# Patient Record
Sex: Female | Born: 1955 | Race: White | Hispanic: No | Marital: Single | Smoking: Never smoker
Health system: Southern US, Community
[De-identification: ages and names within clinical notes are randomized; demographics above are authoritative.]

## PROBLEM LIST (undated history)

## (undated) DIAGNOSIS — M71162 Other infective bursitis, left knee: Secondary | ICD-10-CM

## (undated) DIAGNOSIS — D649 Anemia, unspecified: Secondary | ICD-10-CM

## (undated) DIAGNOSIS — F419 Anxiety disorder, unspecified: Secondary | ICD-10-CM

## (undated) DIAGNOSIS — K759 Inflammatory liver disease, unspecified: Secondary | ICD-10-CM

## (undated) DIAGNOSIS — F329 Major depressive disorder, single episode, unspecified: Secondary | ICD-10-CM

## (undated) DIAGNOSIS — R601 Generalized edema: Secondary | ICD-10-CM

## (undated) DIAGNOSIS — B1920 Unspecified viral hepatitis C without hepatic coma: Secondary | ICD-10-CM

## (undated) DIAGNOSIS — J189 Pneumonia, unspecified organism: Secondary | ICD-10-CM

## (undated) DIAGNOSIS — F32A Depression, unspecified: Secondary | ICD-10-CM

## (undated) DIAGNOSIS — K7460 Unspecified cirrhosis of liver: Secondary | ICD-10-CM

## (undated) HISTORY — PX: CHOLECYSTECTOMY: SHX55

---

## 2010-01-13 ENCOUNTER — Ambulatory Visit: Payer: Self-pay | Admitting: Cardiology

## 2010-01-13 ENCOUNTER — Inpatient Hospital Stay (HOSPITAL_COMMUNITY): Admission: EM | Admit: 2010-01-13 | Discharge: 2010-01-29 | Payer: Self-pay | Admitting: Emergency Medicine

## 2010-01-13 ENCOUNTER — Ambulatory Visit: Payer: Self-pay | Admitting: Internal Medicine

## 2010-01-18 ENCOUNTER — Ambulatory Visit: Payer: Self-pay | Admitting: Internal Medicine

## 2010-01-21 ENCOUNTER — Encounter (INDEPENDENT_AMBULATORY_CARE_PROVIDER_SITE_OTHER): Payer: Self-pay | Admitting: Internal Medicine

## 2010-01-25 ENCOUNTER — Ambulatory Visit: Payer: Self-pay | Admitting: Gastroenterology

## 2010-01-26 ENCOUNTER — Ambulatory Visit: Payer: Self-pay | Admitting: Gastroenterology

## 2010-02-02 ENCOUNTER — Encounter: Payer: Self-pay | Admitting: Internal Medicine

## 2010-02-02 DIAGNOSIS — K769 Liver disease, unspecified: Secondary | ICD-10-CM | POA: Insufficient documentation

## 2010-02-03 ENCOUNTER — Encounter: Payer: Self-pay | Admitting: Internal Medicine

## 2010-02-04 ENCOUNTER — Encounter (INDEPENDENT_AMBULATORY_CARE_PROVIDER_SITE_OTHER): Payer: Self-pay

## 2010-02-04 ENCOUNTER — Encounter (INDEPENDENT_AMBULATORY_CARE_PROVIDER_SITE_OTHER): Payer: Self-pay | Admitting: *Deleted

## 2010-02-04 ENCOUNTER — Ambulatory Visit: Payer: Self-pay | Admitting: Internal Medicine

## 2010-02-04 DIAGNOSIS — K7030 Alcoholic cirrhosis of liver without ascites: Secondary | ICD-10-CM

## 2010-02-05 ENCOUNTER — Telehealth (INDEPENDENT_AMBULATORY_CARE_PROVIDER_SITE_OTHER): Payer: Self-pay

## 2010-02-07 DIAGNOSIS — K7460 Unspecified cirrhosis of liver: Secondary | ICD-10-CM | POA: Insufficient documentation

## 2010-02-07 DIAGNOSIS — B1710 Acute hepatitis C without hepatic coma: Secondary | ICD-10-CM | POA: Insufficient documentation

## 2010-02-08 ENCOUNTER — Encounter: Payer: Self-pay | Admitting: Internal Medicine

## 2010-02-09 ENCOUNTER — Encounter (INDEPENDENT_AMBULATORY_CARE_PROVIDER_SITE_OTHER): Payer: Self-pay

## 2010-02-09 ENCOUNTER — Telehealth (INDEPENDENT_AMBULATORY_CARE_PROVIDER_SITE_OTHER): Payer: Self-pay

## 2010-02-10 ENCOUNTER — Emergency Department (HOSPITAL_COMMUNITY): Admission: EM | Admit: 2010-02-10 | Discharge: 2010-02-10 | Payer: Self-pay | Admitting: Emergency Medicine

## 2010-02-23 ENCOUNTER — Telehealth (INDEPENDENT_AMBULATORY_CARE_PROVIDER_SITE_OTHER): Payer: Self-pay

## 2010-02-26 ENCOUNTER — Encounter: Payer: Self-pay | Admitting: Internal Medicine

## 2010-03-16 ENCOUNTER — Ambulatory Visit: Payer: Self-pay | Admitting: Internal Medicine

## 2010-03-19 ENCOUNTER — Encounter (HOSPITAL_COMMUNITY): Admission: RE | Admit: 2010-03-19 | Discharge: 2010-04-18 | Payer: Self-pay | Admitting: Family Medicine

## 2010-03-22 ENCOUNTER — Ambulatory Visit (HOSPITAL_COMMUNITY): Payer: Self-pay | Admitting: Family Medicine

## 2010-04-01 ENCOUNTER — Emergency Department (HOSPITAL_COMMUNITY): Admission: EM | Admit: 2010-04-01 | Discharge: 2010-04-01 | Payer: Self-pay | Admitting: Emergency Medicine

## 2010-04-05 ENCOUNTER — Encounter: Payer: Self-pay | Admitting: Internal Medicine

## 2010-04-08 ENCOUNTER — Ambulatory Visit (HOSPITAL_COMMUNITY): Payer: Self-pay | Admitting: Psychology

## 2010-04-30 ENCOUNTER — Encounter: Payer: Self-pay | Admitting: Internal Medicine

## 2010-07-16 ENCOUNTER — Encounter: Payer: Self-pay | Admitting: Internal Medicine

## 2010-11-09 NOTE — Letter (Signed)
Summary: [facility] REFERRAL  [facility] REF[facility]   Imported By: Ave Filter 02/04/2010 16:24:43  _____________________________________________________________________  External Attachment:    Type:   Image     Comment:   External Document

## 2010-11-09 NOTE — Letter (Signed)
Summary: disability form  disability form   Imported By: Rosine Beat 04/30/2010 16:34:36  _____________________________________________________________________  External Attachment:    Type:   Image     Comment:   External Document

## 2010-11-09 NOTE — Miscellaneous (Signed)
Summary: aph  Clinical Lists Changes CT Abd/Pelvis WO CM - STATUS: Final  IMAGE                                     Perform Date: 6 Apr11 00:00  Ordered By: Terressa Koyanagi Md , Apr        Ordered Date: 5 Apr11 22:01  Facility: [facility]                               Department: CT  Service Report Text  [facility] Accession Number: [id number]      Clinical Data: Abdominal and pelvic pain.  Cough and fever.    CT ABDOMEN AND PELVIS WITHOUT CONTRAST    Technique:  Multidetector CT imaging of the abdomen and pelvis was   performed following the standard protocol without intravenous   contrast.    Comparison: None    Findings: Consolidation within the lingula and mild airspace   opacity within the right lower lobe likely representing pneumonia.    Cirrhosis and splenomegaly are identified.   There is a small amount of ascites within the abdomen and pelvis.   Mild mesenteric edema is noted.   The gallbladder, adrenal glands, pancreas, and kidneys are   unremarkable.   Please note that parenchymal abnormalities may be missed as   intravenous contrast was not administered.   No evidence of enlarged lymph nodes, biliary dilatation, or   abdominal aortic aneurysm identified.   The bowel, appendix, and bladder are unremarkable.    No acute or suspicious bony abnormalities are identified.    IMPRESSION:   Lingular consolidation and mild right lower lobe airspace disease   compatible with pneumonia.  Follow up to resolution recommended.    Cirrhosis, splenomegaly and small amount of ascites.    Read By:  Rosendo Gros,  M.D.   Released By:  Rosendo Gros,  M.D.  Additional Information  HL7 RESULT STATUS : F  External image : [id number]  External IF Update Timestamp : 2010-01-13:00:11:21.000000     US Abdomen Port - STATUS: Final  IMAGE                                     Perform Date: 6 Apr11 08:21  Ordered By: Hosie Poisson  ,        Ordered Date: 52 Apr11 07:36  Facility:  [facility]                               Department: [place]  Service Report Text  [facility] Accession Number: [id number]      Clinical Data:  Cholecystitis, leukocytosis, jaundice,   hyponatremia, renal insufficiency    ULTRASOUND ABDOMEN PORTABLE:    Technique:  Sonography of upper abdominal structures was performed   portably.    Comparison:  None    Gallbladder:  Distended with upper normal wall thickness.  No   definite gallstones, pericholecystic fluid, or sonographic Murphy   sign.    Common bile duct:  Normal caliber 3 mm diameter.    Liver:  Nodular contours and increased right intimal echogenicity   compatible with cirrhosis.  No definite focal hepatic mass or   nodule.  IVC:  Unremarkable    Pancreas:  Tail incompletely visualized due to bowel gas, remainder   normal appearance.    Spleen:  Appears enlarged, 10.4 cm length. No focal abnormality.    Right kidney:  11.7 cm length. Normal morphology without mass or   hydronephrosis.    Left kidney:  11.7 cm length.  Normal morphology without mass or   hydronephrosis.    Aorta:  Incompletely visualized due to bowel gas, visualized   portion unremarkable.    Other:  No gross ascites.    IMPRESSION:   Cirrhotic appearing liver with mild splenic enlargement.   Distended gallbladder without definite visualization of stones or   tenderness.    Read By:  Lollie Marrow,  M.D.   Released By:  Lollie Marrow,  M.D.  Additional Information  HL7 RESULT STATUS : F  External image : [id number]  External IF Update Timestamp : 2010-01-13:11:48:28.000000     MR Abdomen W/O CM - STATUS: Final  IMAGE                                     Perform Date: 18Apr11 15:13  Ordered By: Irving Shows,          Ordered Date: 18Apr11 14:13  Facility: [facility]                               Department: MRI  Service Report Text  [facility] Accession Number: [id number]      Clinical Data: Cholecystitis.  Hyponatremia.  Renal insufficiency.    Liver failure.  Right upper quadrant pain with fluid retention.   Jaundice.    MRI ABDOMEN WITHOUT CONTRAST    Technique:  Multiplanar multisequence MR imaging of the abdomen was   performed. No intravenous contrast was administered.    Comparison: CT scan from 01/12/2010    Findings: Moderate right pleural effusion is new in the interval.   There is a tiny left pleural effusion.  Overall image quality is   degraded by patient breathing motion throughout image acquisition.    Perihepatic and perisplenic ascites has progressed in the interval.   Nodular liver contour suggests cirrhosis.  The no focal   abnormalities seen in the liver parenchyma on this study without   intravenous contrast material.  No evidence for intrahepatic   biliary dilatation.    No focal abnormalities seen in the spleen.  The stomach, duodenum,   and pancreas are unremarkable.  No evidence for adrenal mass.  The   kidneys are normal in appearance.    Mesenteric fluid is visible.  No evidence for bowel obstruction.   Gallbladder is moderately distended with an irregular, edematous   appearing wall.  The patient has diffuse body wall edema.    IMPRESSION:   Changes in the liver consistent with cirrhosis.    Worsening pleural effusion and interval increase in ascites.    Distended gallbladder with apparent edema and thickening of the   gallbladder wall.  Cholecystitis could have this appearance   although liver disease with hypoproteinemia can also cause   gallbladder wall thickening.    Body wall edema.    Read By:  Kennith Center,  M.D.   Released By:  Kennith Center,  M.D.  Additional Information  HL7 RESULT STATUS : F  External image : [id number]  External IF Update Timestamp : 2010-01-25:15:33:06.000000    L-BMP/BMET (Basic Metabolic Panel) - STATUS: Final                                            Perform Date: 22Apr11 05:12  Ordered ByKristian Covey MD , Lauraine Rinne        Ordered Date:  21Apr11 08:31                                       Last Updated Date: 22Apr11 06:34  Facility: [facility]                               Department: GENL  Accession #: [id number]                    USN:       [id number]  Findings  Result Name                              Result     Abnl   Normal Range     Units      Perf. Loc.  Sodium (NA)                                133        l      135-145          mEq/L  Potassium (K)                              3.5               3.5-5.1          mEq/L  Chloride                                        96                96-112           mEq/L  CO2                                             31                19-32            mEq/L  Glucose                                        87                70-99            mg/dL  BUN  46         h      6-23             mg/dL  Creatinine                                     2.88       h      0.4-1.2          mg/dL  GFR, Est Non African American      17         l      >60              mL/min  GFR, Est African American             21         l      >60              mL/min    Oversized comment, see footnote  1  Calcium                                        8.9               8.4-10.5         mg/dL  Footnotes  1. The eGFR has been calculated     using the MDRD equation.     This calculation has not been     validated in all clinical     situations.     eGFR's persistently     <60 mL/min signify     possible Chronic Kidney Disease.  Additional Information  HL7 RESULT STATUS : F  External IF Update Timestamp : 2010-01-29:06:30:00.000000    L-Hepatic Function Panel (HFP / LFT) - STATUS: Final                                            Perform Date: 21Apr11 04:56  Ordered By: Irving Shows,          Ordered Date: 21Apr11 09:09                                       Last Updated Date: 21Apr11 09:41  Facility: [facility]                               Department:  GENL  Accession #: [id number]                    USN:       [id number]  Findings  Result Name                              Result     Abnl   Normal Range     Units      Perf. Loc.  Bilirubin, Total  12.3       h      0.3-1.2          mg/dL  Bilirubin, Direct                              6.3        h      0.0-0.3          mg/dL  Indirect Bilirubin                             6.0        h      0.3-0.9          mg/dL  Alkaline Phosphatase                    101               39-117           U/L  SGOT (AST)                                  91         h      0-37             U/L  SGPT (ALT)                                   47         h      0-35             U/L  Total  Protein                                 6.2               6.0-8.3          g/dL  Albumin-Blood                                2.1        l      3.5-5.2          g/dL  Additional Information  HL7 RESULT STATUS : F  External IF Update Timestamp : 2010-01-28:09:37:00.000000     L-CBC-with Differential - STATUS: Final                                            Perform Date: 22Apr11 05:12  Ordered ByKristian Covey MD , Lauraine Rinne        Ordered Date: 21Apr11 08:31                                       Last Updated Date: 22Apr11 05:51  Facility: [facility]  Department: GENL  Accession #: [id number]                   USN:       [id number]  Findings  Result Name                              Result     Abnl   Normal Range     Units      Perf. Loc.  WBC                                          7.6               4.0-10.5         K/uL  RBC                                           2.81       l      3.87-5.11        MIL/uL  Hemoglobin (HGB)                      10.1       l      12.0-15.0        g/dL  Hematocrit (HCT)                        28.7       l      36.0-46.0        %  MCV                                        102.3      h      78.0-100.0        fL  MCHC                                       35.0              30.0-36.0        g/dL  RDW                                         15.9       h      11.5-15.5        %  Platelet Count (PLT)                     70         l      150-400          K/uL  Neutrophils, %                             47  43-77            %  Lymphocytes, %                          33                12-46            %  Monocytes, %                             17         h      3-12             %  Eosinophils, %                             3                 0-5              %  Basophils, %                               1                 0-1              %  Neutrophils, Absolute                  3.5               1.7-7.7          K/uL  Lymphocytes, Absolute                2.5               0.7-4.0          K/uL  Monocytes, Absolute                   1.2        h      0.1-1.0          K/uL  Eosinophils, Absolute                   0.2               0.0-0.7          K/uL  Basophils, Absolute                      0.0               0.0-0.1          K/uL  Additional Information  HL7 RESULT STATUS : F  External IF Update Timestamp : 2010-01-29:05:47:00.000000   L-PT (Prothrombin Time or Protime) w/INR - STATUS: Final                                            Perform Date: 21Apr11 04:56  Ordered By: Hosie Poisson  ,        Ordered Date: 20Apr11 16:49  Last Updated Date: 21Apr11 06:11  Facility: [facility]                               Department: GENL  Accession #: [id number]                     USN:       [id number]  Findings  Result Name                              Result     Abnl   Normal Range     Units      Perf. Loc.  Protime ( Prothrombin Time)              27.4       h      11.6-15.2        seconds  INR                                                 2.57       h      0.00-1.49  Additional Information  HL7 RESULT STATUS : F  External IF Update  Timestamp : 2010-01-28:06:08:00.000000    L-Hepatitis A Antibody-Total - STATUS: Final         Perform Date: 8 Apr11 14:00  Ordered By: Jena Gauss MD , Gerrit Friends           Ordered Date: 8 Apr11 13:53                                       Last Updated Date: 9 Apr11 12:20  Facility: [facility]                               Department: GENL  Accession #: [id number]                   USN:       [id number]  Findings  Result Name                              Result     Abnl   Normal Range     Units      Perf. Loc.  Hepatitis A Antibody (Total)             NEGATIVE    Reference range: NEGATIVE    Performed at [facility]  Additional Information  HL7 RESULT STATUS : F  External IF Update Timestamp : 2010-01-16:12:16:00.000000   Ceruloplasmin, Blood - STATUS: Final                                            Perform Date: 8 Apr11 14:00  Ordered By: Jena Gauss MD , Gerrit Friends           Ordered Date: 8 Apr11 13:54  Last Updated Date: 9 Apr11 02:48  Facility: [facility]                               Department: GENL  Accession #: [id number]                   USN:       [id number]  Findings  Result Name                              Result     Abnl   Normal Range     Units      Perf. Loc.  Ceruloplasmin                            23                21-63            mg/dL    Performed at [facility]  Additional Information  HL7 RESULT STATUS : F  External IF Update Timestamp : 2010-01-16:02:44:00.000000   L-Alpha-1 Antitrypsin, Quantitative - STATUS: Final                                            Perform Date: 8 Apr11 14:00  Ordered By: Jena Gauss MD , Gerrit Friends           Ordered Date: 8 Apr11 13:53                                       Last Updated Date: 9 Apr11 02:48  Facility: [facility]                               Department: GENL  Accession #: [id number]                  USN:       [id number]  Findings  Result Name                               Result     Abnl   Normal Range     Units      Perf. Loc.  Alpha-1 Antitrypsin                      262        h      83-200           mg/dL    Performed at [facility]  Additional Information  HL7 RESULT STATUS : F  External IF Update Timestamp : 2010-01-16:02:44:00.000000   L-IgG/IgA/IgM (Imunoglobulins G, A + M) - STATUS: Final                                            Perform Date: 8 Apr11 14:00  Ordered ByJena Gauss MD , Gerrit Friends  Ordered Date: 8 Apr11 13:53                                       Last Updated Date: 43 Apr11 02:48  Facility: [facility]                               Department: GENL  Accession #: [id number]                   USN:       [id number]  Findings  Result Name                              Result     Abnl   Normal Range     Units      Perf. Loc.  IgA (Immunoglobin A), Serum              <7         l      68-378           mg/dL  IgG (Immunoglobin G), Serum              1890       h      [phone]         mg/dL  IgM (Immunoglobin M), Serum              362        h      60-263           mg/dL    Performed at [facility]  Additional Information  HL7 RESULT STATUS : F  External IF Update Timestamp : 2010-01-16:02:44:00.000000   L-Anemia Panel - STATUS: Final                                            Perform Date: 8 Apr11 03:58  Ordered By: Theora Gianotti         Ordered Date: 7 Apr11 12:25                                       Last Updated Date: 9 Apr11 02:33  Facility: [facility]                               Department: GENL  Accession #: [id number]                   USN:       [id number]  Findings  Result Name                              Result     Abnl   Normal Range     Units      Perf. Loc.  RETIC%  2.4               0.4-3.1          %  RBC.                                          3.09       l      3.87-5.11        MIL/uL  Reticulocytes,  Absolute                74.2              19.0-186.0       K/uL  Iron                                               63                42-135           ug/dL  Total Iron Binding Capacity            127        l      250-470          ug/dL  Percent Saturation                         50                20-55            %  UIBC                                            64                                 ug/dL  Vitamin E45                                >2000      h                       pg/mL    Reference range: 211 to 911  Folate, Serum                               10.1                               ng/mL    (NOTE)    Reference Ranges    Deficient:       0.4 - 3.3 ng/mL    Indeterminate:   3.4 - 5.4 ng/mL    Normal:              > 5.4 ng/mL  Ferritin  491        h                       ng/mL    Reference range: 10 to 291    Performed at [facility]  Additional Information  HL7 RESULT STATUS : F  External IF Update Timestamp : 2010-01-16:02:29:00.000000    L-Hepatitis B Surface Antibody - STATUS: Final                                            Perform Date: 8 Apr11 14:00  Ordered By: Jena Gauss MD , Gerrit Friends           Ordered Date: 8 Apr11 13:53                                       Last Updated Date: 9 Apr11 02:18  Facility: [facility]                               Department: GENL  Accession #: [id number]                  USN:       [id number]  Findings  Result Name                              Result     Abnl   Normal Range     Units      Perf. Loc.  Hepatitis B Surface Antibody - Qualitati NEGATIVE          NEG    Performed at [facility]  Additional Information  HL7 RESULT STATUS : F  External IF Update Timestamp : 2010-01-16:02:14:00.000000

## 2010-11-09 NOTE — Letter (Signed)
Summary: [facility]  [facility]   Imported By: Rosine Beat 04/05/2010 11:13:40  _____________________________________________________________________  External Attachment:    Type:   Image     Comment:   External Document

## 2010-11-09 NOTE — Miscellaneous (Signed)
Summary: Orders Update  Clinical Lists Changes  Problems: Added new problem of UNSPECIFIED DISORDER OF LIVER (ICD-573.9) Orders: Added new Test order of T-Hepatic Function [phone]) - Signed Added new Test order of T-PT (Prothrombin Time) (09811) - Signed

## 2010-11-09 NOTE — Progress Notes (Signed)
Summary: PHONE MESSAGE WITH CONTACT NUMBERS  Phone Note Call from Patient   Caller: Mom Summary of Call: Pt's mom called and left VM with contact phone numbers. Home is [phone]  and pt's sister's number which is more  likely to get answer is [phone]. ( She said they have been to [facility] and will be going back Thurs.) Initial call taken by: Cloria Spring LPN,  Feb 23, 2010 9:31 AM

## 2010-11-09 NOTE — Progress Notes (Signed)
Summary: Phone call from Kem at [facility] in ref to appt  Phone Note From Other Clinic   Caller: Kim/ [facility] Summary of Call: Kem from [facility] called in reference to pt tentively scheduled for the fall. She wants to know if the later date was because of just recently stopping alcohol, and waiting the 6 months. She said after reviewing pt's hx  and notes, the pt is very sick and she recommends them seeing her ASAP, and getting her worked up for counseling  and assessed. They can see her in [place] in 2 weeks or she could be seen at the clinic in [place] on 03/01/2010. She said they have a clinic there the fourth Mon of every month. ( She said Dr. Jena Gauss is perfectly correct in waiting the six months for abstinence of alcohol, but since she is very sick, they are willing to see earlier). She just wants a call back at [phone] to know where pt would rather go to be seen.     Initial call taken by: Cloria Spring LPN,  February 05, 2010 11:37 AM     Appended Document: Phone call from [facility] at [facility] in ref to appt I appreciate there willingness to go ahead and see her sooner rather than later. Let's go ahead and take them up on the offer for the earlier appointment. [facility] in 2 weeks should suffice.  Appended Document: Phone call from Kem at [facility] in ref to appt Informed Kem and she will schedule the pt for [facility] in 2 weeks.

## 2010-11-09 NOTE — Letter (Signed)
Summary: Discharge Summary  Discharge Summary   Imported By: Ave Filter 02/26/2010 10:13:49  _____________________________________________________________________  External Attachment:    Type:   Image     Comment:   External Document

## 2010-11-09 NOTE — Letter (Signed)
Summary: External Other  External Other   Imported By: Peggyann Shoals 02/03/2010 11:08:46  _____________________________________________________________________  External Attachment:    Type:   Image     Comment:   External Document

## 2010-11-09 NOTE — Assessment & Plan Note (Signed)
Summary: fu ov in 6 weeks/cirrhosis,etoh,hcv/ss   Visit Type:  Follow-up Visit Primary Care Provider:  Nyland  Chief Complaint:  F/U cirrhosis, etoh, and hcv.  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 Recently hospitalized for prolonged  period of tme locally for streptococcal pneumonia, renal failure and anasarca.  She apparently came back to the hospital a couple weeks ago with the encephalopathy.  She was transferred to [facility] where she spent over a week. There she had a liver transplant workup she describes undergoing EGD and a colonoscopy - reportedly had no varices. She states she may have been vaccinated against hepatitis A and B. She is now on lactulose a tablespoon 3-4 times daily to accomplish a 3-4 BMs daily. Also she is on Xifaxin 550 mg twice daily. She feels that she is doing well at this time.  She lost a lot of weight in terms of fluid. She weighs 140 pounds today. Labs from [facility] from yesterday: white count 3.8 hemoglobin hematocrit 8.0 23.2 platelet count 49,000.  No melena or hematochezia.   Current Medications (verified): 1)  Lactulose .... Twice Daily As Needed 2)  Prilosec 20 Mg Cpdr (Omeprazole) .... Take 1 Tablet By Mouth Once A Day 3)  Thiamine Hcl 100 Mg Tabs (Thiamine Hcl) .... Take 1 Tablet By Mouth Once A Day 4)  Xifaxan 550 Mg Tabs (Rifaximin) .... Take 1 Tablet By Mouth Three Times A Day  Allergies (verified): 1)  ! Tylenol 2)  ! * Nyquil 3)  ! 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  Alcohol Use - no Patient does not get regular exercise.   Vital Signs:  Patient profile:   55 year old female Height:      64.5 inches Weight:      140 pounds BMI:     23.75 Temp:     99.1 degrees F oral Pulse rate:   80 / minute BP sitting:   130 / 70  (left arm) Cuff size:   regular  Vitals Entered By: Cloria Spring LPN (March 17, 5783 2:48 PM)  Physical Exam  General:  this lady is alert conversant oriented she has no asterixis she does have light scleral icterus. 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 She is now in the hands of the [facility] liver transplant program. Apparently, develped  significant encephalopathy recently now improved on Xifaxin  and lactulose. She appears well compensated toda.  In fact, she looks  amazingly good compared to the last time I saw her in the hospital.  She is significantly anemic with a low platelet count. She recently had a upper and lower GI  tract endoscopy per her report.  Recommendations: At this time, she does not need any diuretic therapy by my assessment  Continue Xifaxan and lactulose; agree with titrating lactulose to 3-4 semi-formed stools daily  Hopefully, will receive the correspondence regarding her recent [facility] hospitalization workup down here. Unless something comes up, we'll plan this is nicely back in 3 months.  She is to weigh herself daily and let us know if she gains anymore than 5 pounds . She is to continue to be adhered to a 2 g sodium diet.  Appended  Document: Orders Update    Clinical Lists Changes  Orders: Added new Service order of Est. Patient Level IV (54098) - Signed      Appended Document: fu ov in 6 weeks/cirrhosis,etoh,hcv/ss Reminder already noted in computer for ov.    AS

## 2010-11-09 NOTE — Letter (Signed)
Summary: [facility]  [facility]   Imported By: Rosine Beat 04/05/2010 11:25:42  _____________________________________________________________________  External Attachment:    Type:   Image     Comment:   External Document

## 2010-11-09 NOTE — Letter (Signed)
Summary: Scheduled Appointment  [facility]  [address]   [place], [place] [address]   Phone: [phone]  Fax: [phone]    February 04, 2010   Dear: Kim Harris            DOB: November 24, 1955    I have been instructed to schedule you an appointment in our office.  Your appointment is as follows:   Date: March 16, 2010   Time:  2:45PM     Please be here 15 minutes early.   Provider: DR Jena Gauss    Please contact the office if you need to reschedule this appointment for a more convenient time.   Thank you,    Diana Eves       [facility] Ph: [phone]   Fax: [phone]

## 2010-11-09 NOTE — Consult Note (Signed)
Summary: Consultation Report  Consultation Report   Imported By: Diana Eves 01/13/2010 16:13:04  _____________________________________________________________________  External Attachment:    Type:   Image     Comment:   External Document  Appended Document: Consultation Report Patient needs OV in 4-6 weeks RE: f/u hosp, anemia, Heme positive stool, cirrhosis, Positive HCV ab  Appended Document: Consultation Report RMR would like OV w/ extender in 3 weeks  Appended Document: Consultation Report LMOM for pt to call regarding appt- cdg    Appended Document: Consultation Report mailed letter with appt time of 02/15/10 @ 130 w/KJ  Appended Document: Consultation Report Pt needs OPV APR 28 OR 29, DX: Severe hepatic dysfunction. Needs PT/INR and HFP.  Appended Document: Consultation Report LMOM for pt to return call  Appended Document: Consultation Report pt aware of appt on 4/28 at 1130 w/RMR  Appended Document: Consultation Report tried to call pt- LMOM to return call  Appended Document: Consultation Report tried to call pt- LM

## 2010-11-09 NOTE — Assessment & Plan Note (Signed)
Summary: DX: severe hepatic dysfunction,needs PT/INR and HFP/ss   Visit Type:  Follow-up Visit Primary Care Provider:  Nyland  Chief Complaint:  F/U hepatic dysfunction.  History of Present Illness: 55 year old lady recently hospitalized for multi-lobar streptococcal pneumonia found to have advanced chronic liver disease with cirrhosis in the setting of long-standing alcohol consumption; positive hepatitis C antibody with viremia confirmed with a positive PCR. She had a protracted hospitalization but has improved significantly. She did have an element of chronic renal failure and was seen by the nephrology service.  MELD score of 37 during hospitalization.  She presents today in followup along with her mother. She states she is doing much better she's lost 20 pounds and she was discharged on Zaroxolyn and aldactone. She tells me she's done consuming alcohol. She is not immune hepatitis B or A.  CT and MRI of the abdomen failed to demonstrate any evidence of hepatoma. I have the most recent labs from Dr. Joyce Copa office 4 2611 creatinine 3.20 slightly up above 2.88 on April 21 BUN 51 was 46 total bilirubin 11.7 not fractionated ALT AST 44 and 92 respectively albumin 2.7 white count 6.5 hemoglobin 10.9 platelet count 69,000 she has been on Zaroxolyn 5 mg orally daily and  Aldactone 25 mg orally twice daily .  States she's been adherent  to a 2 g sodium diet. She has never had an EGD or colonoscopy. She is due for screening.   Preventive Screening-Counseling & Management  Alcohol-Tobacco     Smoking Status: never  Caffeine-Diet-Exercise     Does Patient Exercise: no  Current Medications (verified): 1)  Lactulose .... Twice Daily As Needed 2)  Zaroxolyn 5 Mg Tabs (Metolazone) .... Take 1 Tablet By Mouth Once A Day As Needed 3)  Oxycodone Hcl 5 Mg Tabs (Oxycodone Hcl) .... One Tablet Every 6 Hours As Needed 4)  Potassium Chloride 20 Meq Pack (Potassium Chloride) .... Take 1 Tablet By Mouth Once  A Day 5)  Prilosec 20 Mg Cpdr (Omeprazole) .... Take 1 Tablet By Mouth Once A Day 6)  Aldactone 25 Mg Tabs (Spironolactone) .... Take 1 Tablet By Mouth Once A Day 7)  Thiamine Hcl 100 Mg Tabs (Thiamine Hcl) .... Take 1 Tablet By Mouth Once A Day 8)  Torsemide 20 Mg Tabs (Torsemide) .... Two Tablets Daily 9)  Zofran 4 Mg Tabs (Ondansetron Hcl) .... As Needed  Allergies (verified): 1)  ! Tylenol 2)  ! 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  Alcohol Use - no Patient does not get regular exercise.   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  Alcohol Use - no Patient does not get regular exercise.  Smoking Status:  never Does Patient Exercise:  no  Vital Signs:  Patient  profile:   55 year old female Height:      64.5 inches Weight:      136 pounds BMI:     23.07 Temp:     98.9 degrees F oral Pulse rate:   96 / minute BP sitting:   110 / 60  (left arm) Cuff size:   regular  Vitals Entered By: Cloria Spring LPN (February 04, 2010 11:50 AM)  Physical Exam  General:  alert conversant lady in no acute distress she is well oriented. She is accompanied by her mother had multiple questions which I answered. Eyes:  deep scleral icterus persists Lungs:   lungs are fairly clear to auscultation Heart:  regular rate rhythm without murmur gallop rub Abdomen:  nondistended positive bowel sounds soft nontender right fluid wave or shifting dullness I do believe I can blot spleen liver is not enlarged by percussion its nontender Extremities:  she has no lower extremity edema  Impression & Recommendations: Impression:  Recently decompensated advanced liver disease the setting of pneumonia. This ilady has a history of chronic hepatitis C and history of alcohol abuse to account for her liver disease. She also has recent element of renal failure failure. Her immunoglobulins are somewhat elevated but I doubt we are dealing with autoimmune hepatitis. More likely, they are secondary to a recent bacterial infection. I also doubt hepatorenal syndrome at this point in time. However, her renal function is be watched closely. BUN/creatinine are up just a bit and this may be , in part, related to diuretic therapy.  I had a frank discussion with the patient and her mother regarding the gravity of the situation as it pertains to her liver. I explained how she has irreversible severe damage to her liver. I discussed the absolute necessity of 100% abstinence from alcohol from here on out. The potential for liver transplantation in the future has also been reviewed. However, I informed them that a good six-month period of abstinence would be needed before further consideration of transplant evaluation could be made.  She is susceptible hepatitis ANB. We'll get her vaccinated.  Continue 2 g sodium diet.; we'll back off on her Aldactone 25 mg once daily and Zaroxolyn 5 mg every other day; we'll check her BUN and creatinine in one week.  Would continue lactulose to have at least 2-3 bowel movements daily. There is no clinical evidence for encephalopathy this time  Screening EGD and colonoscopy in the near future. However, she needs to be given additional time to recover from  recent acute illness.  We'll tentatively plan to get her down to the transplant clinic in [facility] in the fall of this year.  We'll plan to see her back in 6 weeks from now.  Appended Document: Orders Update    Clinical Lists Changes  Problems: Added new problem of CIRRHOSIS (ICD-571.5) Added new problem of HEPATITIS C (ICD-070.51) Added new problem of ALCOHOLIC CIRRHOSIS OF LIVER (ICD-571.2) Orders: Added new Service order of Est. Patient Level V ([address]) - Signed      Appended Document: DX: severe hepatic dysfunction,needs PT/INR and HFP/ss pt aware of appt for 03/16/10 @ 2:45pm w/RMR

## 2010-11-09 NOTE — Letter (Signed)
Summary: DISABILITY DETERMINATION  [place]   Imported By: Rexene Alberts 07/16/2010 11:39:59  _____________________________________________________________________  External Attachment:    Type:   Image     Comment:   External Document

## 2010-11-09 NOTE — Progress Notes (Signed)
----   Converted from flag ---- ---- 02/04/2010 4:08 PM, Jonathon Bellows MD, Caleen Essex wrote: Please let patient know I reviewed outside records. Still with some renal failure. Need to decrease Aldactone 25 mg once daily and decrease Zaroxolyn 5 mg every other day. Check a BMET in one week ------------------------------  Appended Document:  Spoke with Dr. Jena Gauss. Pt was on Aldactone 25 mg daily and Zaroxolyn 5 mg as needed. He said to find out how often pt had been taking the Zaroxolyn.Called and left message for return call at home  number listed. Called work number and she has not worked for few days, not sure when she will be back. Will mail letter for pt to call.

## 2010-11-09 NOTE — Letter (Signed)
Summary: Plan of Care, Need to Discuss  [facility]  [address]   [place], [place] [address]   Phone: [phone]  Fax: [phone]    Feb 09, 2010  Kim Harris [address] [place], [place]  [address] 11-22-1955   Dear [name],   We are writing this letter to inform you of treatment plans and/or discuss your plan of care.  We have tried several times to contact you; however, we have yet to reach you.  We ask that you please contact our office for follow-up on your gastrointestinal issues.  We can  be reached at [phone] to schedule an appointment, or to speak with someone regarding your health care needs.  Please do not neglect your health.   Sincerely,    Cloria Spring LPN  [facility] Ph: [phone]    Fax: [phone]

## 2010-11-09 NOTE — Letter (Signed)
Summary: External Other  External Other   Imported By: Peggyann Shoals 02/08/2010 12:23:22  _____________________________________________________________________  External Attachment:    Type:   Image     Comment:   External Document

## 2010-12-26 LAB — COMPREHENSIVE METABOLIC PANEL
ALT: 34 U/L (ref 0–35)
AST: 49 U/L — ABNORMAL HIGH (ref 0–37)
Albumin: 2.9 g/dL — ABNORMAL LOW (ref 3.5–5.2)
Alkaline Phosphatase: 130 U/L — ABNORMAL HIGH (ref 39–117)
BUN: 5 mg/dL — ABNORMAL LOW (ref 6–23)
CO2: 22 mEq/L (ref 19–32)
Calcium: 8.6 mg/dL (ref 8.4–10.5)
Chloride: 107 mEq/L (ref 96–112)
Creatinine, Ser: 0.97 mg/dL (ref 0.4–1.2)
GFR calc Af Amer: >60 mL/min (ref >60)
GFR calc non Af Amer: 60 mL/min — ABNORMAL LOW (ref >60)
Glucose, Bld: 102 mg/dL — ABNORMAL HIGH (ref 70–99)
Potassium: 3.6 mEq/L (ref 3.5–5.1)
Sodium: 136 mEq/L (ref 135–145)
Total Bilirubin: 3.9 mg/dL — ABNORMAL HIGH (ref 0.3–1.2)
Total Protein: 7.5 g/dL (ref 6.0–8.3)

## 2010-12-26 LAB — URINE MICROSCOPIC-ADD ON

## 2010-12-26 LAB — URINE CULTURE: Colony Count: >=100000

## 2010-12-26 LAB — CBC
HCT: 28.4 % — ABNORMAL LOW (ref 36.0–46.0)
Hemoglobin: 9.9 g/dL — ABNORMAL LOW (ref 12.0–15.0)
MCH: 33 pg (ref 26.0–34.0)
MCHC: 34.8 g/dL (ref 30.0–36.0)
MCV: 95 fL (ref 78.0–100.0)
Platelets: 49 10*3/uL — ABNORMAL LOW (ref 150–400)
RBC: 2.99 MIL/uL — ABNORMAL LOW (ref 3.87–5.11)
RDW: 17.9 % — ABNORMAL HIGH (ref 11.5–15.5)
WBC: 5.6 10*3/uL (ref 4.0–10.5)

## 2010-12-26 LAB — URINALYSIS, ROUTINE W REFLEX MICROSCOPIC
Glucose, UA: 100 mg/dL — AB
Hgb urine dipstick: NEGATIVE
Ketones, ur: 15 mg/dL — AB
Leukocytes, UA: NEGATIVE
Nitrite: POSITIVE — AB
Protein, ur: 30 mg/dL — AB
Specific Gravity, Urine: >1.03 — ABNORMAL HIGH (ref 1.005–1.030)
Urobilinogen, UA: 4 mg/dL — ABNORMAL HIGH (ref 0.0–1.0)
pH: 5 (ref 5.0–8.0)

## 2010-12-26 LAB — DIFFERENTIAL
Basophils Absolute: 0 10*3/uL (ref 0.0–0.1)
Basophils Relative: 1 % (ref 0–1)
Eosinophils Absolute: 0.1 10*3/uL (ref 0.0–0.7)
Eosinophils Relative: 1 % (ref 0–5)
Lymphocytes Relative: 25 % (ref 12–46)
Lymphs Abs: 1.4 10*3/uL (ref 0.7–4.0)
Monocytes Absolute: 0.4 10*3/uL (ref 0.1–1.0)
Monocytes Relative: 7 % (ref 3–12)
Neutro Abs: 3.7 10*3/uL (ref 1.7–7.7)
Neutrophils Relative %: 66 % (ref 43–77)

## 2010-12-27 LAB — CROSSMATCH: ABO/RH(D): A POS

## 2010-12-27 LAB — ABO/RH: ABO/RH(D): A POS

## 2010-12-27 LAB — HEMOGLOBIN AND HEMATOCRIT, BLOOD
HCT: 23.3 % — ABNORMAL LOW (ref 36.0–46.0)
Hemoglobin: 8 g/dL — ABNORMAL LOW (ref 12.0–15.0)

## 2010-12-28 LAB — DIFFERENTIAL
Basophils Absolute: 0 10*3/uL (ref 0.0–0.1)
Basophils Absolute: 0 10*3/uL (ref 0.0–0.1)
Basophils Absolute: 0 10*3/uL (ref 0.0–0.1)
Basophils Absolute: 0 10*3/uL (ref 0.0–0.1)
Basophils Absolute: 0.1 10*3/uL (ref 0.0–0.1)
Basophils Relative: 1 % (ref 0–1)
Basophils Relative: 0 % (ref 0–1)
Basophils Relative: 1 % (ref 0–1)
Eosinophils Absolute: 0 10*3/uL (ref 0.0–0.7)
Eosinophils Absolute: 0.1 10*3/uL (ref 0.0–0.7)
Eosinophils Absolute: 0.2 10*3/uL (ref 0.0–0.7)
Eosinophils Absolute: 0.2 10*3/uL (ref 0.0–0.7)
Eosinophils Relative: 0 % (ref 0–5)
Eosinophils Relative: 1 % (ref 0–5)
Eosinophils Relative: 2 % (ref 0–5)
Eosinophils Relative: 3 % (ref 0–5)
Lymphocytes Relative: 11 % — ABNORMAL LOW (ref 12–46)
Lymphocytes Relative: 16 % (ref 12–46)
Lymphocytes Relative: 17 % (ref 12–46)
Lymphocytes Relative: 17 % (ref 12–46)
Lymphocytes Relative: 28 % (ref 12–46)
Lymphs Abs: 1.3 10*3/uL (ref 0.7–4.0)
Lymphs Abs: 1.9 10*3/uL (ref 0.7–4.0)
Lymphs Abs: 2.5 10*3/uL (ref 0.7–4.0)
Lymphs Abs: 2.7 10*3/uL (ref 0.7–4.0)
Monocytes Absolute: 0.5 10*3/uL (ref 0.1–1.0)
Monocytes Absolute: 0.9 10*3/uL (ref 0.1–1.0)
Monocytes Absolute: 1.1 10*3/uL — ABNORMAL HIGH (ref 0.1–1.0)
Monocytes Absolute: 1.3 10*3/uL — ABNORMAL HIGH (ref 0.1–1.0)
Monocytes Relative: 7 % (ref 3–12)
Monocytes Relative: 13 % — ABNORMAL HIGH (ref 3–12)
Monocytes Relative: 17 % — ABNORMAL HIGH (ref 3–12)
Monocytes Relative: 7 % (ref 3–12)
Neutro Abs: 3.2 10*3/uL (ref 1.7–7.7)
Neutro Abs: 3.5 10*3/uL (ref 1.7–7.7)
Neutro Abs: 5.5 10*3/uL (ref 1.7–7.7)
Neutro Abs: 8.5 10*3/uL — ABNORMAL HIGH (ref 1.7–7.7)
Neutrophils Relative %: 79 % — ABNORMAL HIGH (ref 43–77)
Neutrophils Relative %: 47 % (ref 43–77)
Neutrophils Relative %: 69 % (ref 43–77)
Neutrophils Relative %: 81 % — ABNORMAL HIGH (ref 43–77)

## 2010-12-28 LAB — BASIC METABOLIC PANEL
BUN: 33 mg/dL — ABNORMAL HIGH (ref 6–23)
BUN: 46 mg/dL — ABNORMAL HIGH (ref 6–23)
CO2: 29 mEq/L (ref 19–32)
Calcium: 8.2 mg/dL — ABNORMAL LOW (ref 8.4–10.5)
Calcium: 8.7 mg/dL (ref 8.4–10.5)
Calcium: 8.9 mg/dL (ref 8.4–10.5)
Chloride: 100 mEq/L (ref 96–112)
Creatinine, Ser: 1.99 mg/dL — ABNORMAL HIGH (ref 0.4–1.2)
Creatinine, Ser: 2.88 mg/dL — ABNORMAL HIGH (ref 0.4–1.2)
GFR calc Af Amer: 21 mL/min — ABNORMAL LOW (ref >60)
GFR calc Af Amer: 32 mL/min — ABNORMAL LOW (ref >60)
GFR calc non Af Amer: 22 mL/min — ABNORMAL LOW (ref >60)
GFR calc non Af Amer: 26 mL/min — ABNORMAL LOW (ref >60)
Glucose, Bld: 87 mg/dL (ref 70–99)
Glucose, Bld: 99 mg/dL (ref 70–99)
Potassium: 4.3 mEq/L (ref 3.5–5.1)
Sodium: 127 mEq/L — ABNORMAL LOW (ref 135–145)
Sodium: 132 mEq/L — ABNORMAL LOW (ref 135–145)

## 2010-12-28 LAB — CBC
HCT: 27 % — ABNORMAL LOW (ref 36.0–46.0)
HCT: 29.8 % — ABNORMAL LOW (ref 36.0–46.0)
Hemoglobin: 10.6 g/dL — ABNORMAL LOW (ref 12.0–15.0)
Hemoglobin: 10.3 g/dL — ABNORMAL LOW (ref 12.0–15.0)
Hemoglobin: 10.8 g/dL — ABNORMAL LOW (ref 12.0–15.0)
Hemoglobin: 9.7 g/dL — ABNORMAL LOW (ref 12.0–15.0)
Hemoglobin: 9.8 g/dL — ABNORMAL LOW (ref 12.0–15.0)
MCHC: 35.3 g/dL (ref 30.0–36.0)
MCHC: 35.8 g/dL (ref 30.0–36.0)
MCV: 100.4 fL — ABNORMAL HIGH (ref 78.0–100.0)
MCV: 101.4 fL — ABNORMAL HIGH (ref 78.0–100.0)
MCV: 102.7 fL — ABNORMAL HIGH (ref 78.0–100.0)
MCV: 102.8 fL — ABNORMAL HIGH (ref 78.0–100.0)
Platelets: 79 10*3/uL — ABNORMAL LOW (ref 150–400)
Platelets: 70 10*3/uL — ABNORMAL LOW (ref 150–400)
Platelets: 78 10*3/uL — ABNORMAL LOW (ref 150–400)
Platelets: 85 10*3/uL — ABNORMAL LOW (ref 150–400)
RBC: 2.69 MIL/uL — ABNORMAL LOW (ref 3.87–5.11)
RBC: 2.72 MIL/uL — ABNORMAL LOW (ref 3.87–5.11)
RBC: 2.81 MIL/uL — ABNORMAL LOW (ref 3.87–5.11)
RBC: 3.03 MIL/uL — ABNORMAL LOW (ref 3.87–5.11)
RDW: 16.5 % — ABNORMAL HIGH (ref 11.5–15.5)
RDW: 17 % — ABNORMAL HIGH (ref 11.5–15.5)
RDW: 17.4 % — ABNORMAL HIGH (ref 11.5–15.5)
WBC: 7.7 10*3/uL (ref 4.0–10.5)
WBC: 11.7 10*3/uL — ABNORMAL HIGH (ref 4.0–10.5)
WBC: 7.6 10*3/uL (ref 4.0–10.5)
WBC: 7.9 10*3/uL (ref 4.0–10.5)
WBC: 8.4 10*3/uL (ref 4.0–10.5)

## 2010-12-28 LAB — UIFE/LIGHT CHAINS/TP QN, 24-HR UR
Alpha 2, Urine: DETECTED — AB
Beta, Urine: DETECTED — AB
Free Kappa Lt Chains,Ur: 9.81 mg/dL — ABNORMAL HIGH (ref 0.04–1.51)
Free Lt Chn Excr Rate: 174.13 mg/d
Gamma Globulin, Urine: DETECTED — AB
Total Protein, Urine-Ur/day: 201 mg/d — ABNORMAL HIGH (ref 10–140)
Total Protein, Urine: 11.3 mg/dL

## 2010-12-28 LAB — PROTIME-INR
Prothrombin Time: 30.2 seconds — ABNORMAL HIGH (ref 11.6–15.2)
Prothrombin Time: 30.8 seconds — ABNORMAL HIGH (ref 11.6–15.2)

## 2010-12-28 LAB — RENAL FUNCTION PANEL
Albumin: 2.6 g/dL — ABNORMAL LOW (ref 3.5–5.2)
BUN: 35 mg/dL — ABNORMAL HIGH (ref 6–23)
BUN: 41 mg/dL — ABNORMAL HIGH (ref 6–23)
CO2: 22 mEq/L (ref 19–32)
Calcium: 8.1 mg/dL — ABNORMAL LOW (ref 8.4–10.5)
Calcium: 8.5 mg/dL (ref 8.4–10.5)
Calcium: 8.7 mg/dL (ref 8.4–10.5)
Chloride: 101 mEq/L (ref 96–112)
Creatinine, Ser: 2.24 mg/dL — ABNORMAL HIGH (ref 0.4–1.2)
GFR calc Af Amer: 26 mL/min — ABNORMAL LOW (ref >60)
GFR calc Af Amer: 31 mL/min — ABNORMAL LOW (ref >60)
GFR calc non Af Amer: 22 mL/min — ABNORMAL LOW (ref >60)
GFR calc non Af Amer: 26 mL/min — ABNORMAL LOW (ref >60)
Glucose, Bld: 89 mg/dL (ref 70–99)
Glucose, Bld: 90 mg/dL (ref 70–99)
Glucose, Bld: 94 mg/dL (ref 70–99)
Phosphorus: 3.8 mg/dL (ref 2.3–4.6)
Phosphorus: 3.9 mg/dL (ref 2.3–4.6)
Phosphorus: 4.2 mg/dL (ref 2.3–4.6)
Potassium: 3.6 mEq/L (ref 3.5–5.1)
Potassium: 4.4 mEq/L (ref 3.5–5.1)
Sodium: 128 mEq/L — ABNORMAL LOW (ref 135–145)
Sodium: 129 mEq/L — ABNORMAL LOW (ref 135–145)

## 2010-12-28 LAB — COMPREHENSIVE METABOLIC PANEL
ALT: 67 U/L — ABNORMAL HIGH (ref 0–35)
AST: 105 U/L — ABNORMAL HIGH (ref 0–37)
AST: 114 U/L — ABNORMAL HIGH (ref 0–37)
AST: 152 U/L — ABNORMAL HIGH (ref 0–37)
Albumin: 2.3 g/dL — ABNORMAL LOW (ref 3.5–5.2)
Albumin: 2.7 g/dL — ABNORMAL LOW (ref 3.5–5.2)
Alkaline Phosphatase: 118 U/L — ABNORMAL HIGH (ref 39–117)
Alkaline Phosphatase: 118 U/L — ABNORMAL HIGH (ref 39–117)
Alkaline Phosphatase: 122 U/L — ABNORMAL HIGH (ref 39–117)
BUN: 76 mg/dL — ABNORMAL HIGH (ref 6–23)
CO2: 19 mEq/L (ref 19–32)
Calcium: 8.8 mg/dL (ref 8.4–10.5)
Calcium: 7.8 mg/dL — ABNORMAL LOW (ref 8.4–10.5)
Chloride: 100 mEq/L (ref 96–112)
Chloride: 99 mEq/L (ref 96–112)
Creatinine, Ser: 4 mg/dL — ABNORMAL HIGH (ref 0.4–1.2)
Creatinine, Ser: 2.03 mg/dL — ABNORMAL HIGH (ref 0.4–1.2)
Creatinine, Ser: 2.44 mg/dL — ABNORMAL HIGH (ref 0.4–1.2)
GFR calc Af Amer: 25 mL/min — ABNORMAL LOW (ref >60)
GFR calc Af Amer: 29 mL/min — ABNORMAL LOW (ref >60)
GFR calc Af Amer: 31 mL/min — ABNORMAL LOW (ref >60)
GFR calc non Af Amer: 21 mL/min — ABNORMAL LOW (ref >60)
Glucose, Bld: 83 mg/dL (ref 70–99)
Potassium: 3.9 mEq/L (ref 3.5–5.1)
Potassium: 4 mEq/L (ref 3.5–5.1)
Potassium: 4.1 mEq/L (ref 3.5–5.1)
Sodium: 131 mEq/L — ABNORMAL LOW (ref 135–145)
Sodium: 124 mEq/L — ABNORMAL LOW (ref 135–145)
Total Bilirubin: 13.1 mg/dL — ABNORMAL HIGH (ref 0.3–1.2)
Total Bilirubin: 14.4 mg/dL — ABNORMAL HIGH (ref 0.3–1.2)
Total Protein: 6 g/dL (ref 6.0–8.3)

## 2010-12-28 LAB — CULTURE, BLOOD (ROUTINE X 2)
Culture: NO GROWTH
Culture: NO GROWTH
Report Status: 5102011

## 2010-12-28 LAB — CRYOGLOBULIN: Cryoglobulin: DETECTED

## 2010-12-28 LAB — URINALYSIS, ROUTINE W REFLEX MICROSCOPIC
Bilirubin Urine: NEGATIVE
Hgb urine dipstick: NEGATIVE
Ketones, ur: NEGATIVE mg/dL
Protein, ur: NEGATIVE mg/dL
Urobilinogen, UA: 0.2 mg/dL (ref 0.0–1.0)

## 2010-12-28 LAB — PROTEIN, URINE, 24 HOUR
Collection Interval-UPROT: 24 hours
Protein, 24H Urine: 142 mg/d — ABNORMAL HIGH (ref 50–100)
Protein, Urine: 8 mg/dL
Urine Total Volume-UPROT: 1775 mL

## 2010-12-28 LAB — APTT: aPTT: 49 seconds — ABNORMAL HIGH (ref 24–37)

## 2010-12-28 LAB — HEPATIC FUNCTION PANEL
ALT: 56 U/L — ABNORMAL HIGH (ref 0–35)
AST: 127 U/L — ABNORMAL HIGH (ref 0–37)
Albumin: 2.3 g/dL — ABNORMAL LOW (ref 3.5–5.2)
Alkaline Phosphatase: 101 U/L (ref 39–117)
Alkaline Phosphatase: 102 U/L (ref 39–117)
Bilirubin, Direct: 6.3 mg/dL — ABNORMAL HIGH (ref 0.0–0.3)
Indirect Bilirubin: 6 mg/dL — ABNORMAL HIGH (ref 0.3–0.9)
Indirect Bilirubin: 6.1 mg/dL — ABNORMAL HIGH (ref 0.3–0.9)
Indirect Bilirubin: 6.5 mg/dL — ABNORMAL HIGH (ref 0.3–0.9)
Total Bilirubin: 12.3 mg/dL — ABNORMAL HIGH (ref 0.3–1.2)
Total Bilirubin: 13.3 mg/dL — ABNORMAL HIGH (ref 0.3–1.2)
Total Protein: 6.2 g/dL (ref 6.0–8.3)

## 2010-12-28 LAB — URINE CULTURE

## 2010-12-28 LAB — COMPLEMENT, TOTAL: Compl, Total (CH50): 12 U/mL — ABNORMAL LOW (ref 31–60)

## 2010-12-28 LAB — RHEUMATOID FACTOR: Rhuematoid fact SerPl-aCnc: 189 IU/mL — ABNORMAL HIGH (ref 0–20)

## 2010-12-28 LAB — VANCOMYCIN, TROUGH: Vancomycin Tr: 11.9 ug/mL (ref 10.0–20.0)

## 2010-12-29 LAB — LIPASE, BLOOD: Lipase: 17 U/L (ref 11–59)

## 2010-12-29 LAB — BASIC METABOLIC PANEL
BUN: 38 mg/dL — ABNORMAL HIGH (ref 6–23)
CO2: 19 mEq/L (ref 19–32)
CO2: 21 mEq/L (ref 19–32)
CO2: 24 mEq/L (ref 19–32)
Calcium: 7.1 mg/dL — ABNORMAL LOW (ref 8.4–10.5)
Chloride: 100 mEq/L (ref 96–112)
Chloride: 104 mEq/L (ref 96–112)
Creatinine, Ser: 1.46 mg/dL — ABNORMAL HIGH (ref 0.4–1.2)
Creatinine, Ser: 1.58 mg/dL — ABNORMAL HIGH (ref 0.4–1.2)
Creatinine, Ser: 2.09 mg/dL — ABNORMAL HIGH (ref 0.4–1.2)
GFR calc Af Amer: 30 mL/min — ABNORMAL LOW (ref >60)
GFR calc Af Amer: 41 mL/min — ABNORMAL LOW (ref >60)
Glucose, Bld: 111 mg/dL — ABNORMAL HIGH (ref 70–99)
Glucose, Bld: 81 mg/dL (ref 70–99)
Glucose, Bld: 94 mg/dL (ref 70–99)
Sodium: 130 mEq/L — ABNORMAL LOW (ref 135–145)
Sodium: 134 mEq/L — ABNORMAL LOW (ref 135–145)

## 2010-12-29 LAB — BILIRUBIN, FRACTIONATED(TOT/DIR/INDIR)
Bilirubin, Direct: 2.2 mg/dL — ABNORMAL HIGH (ref 0.0–0.3)
Indirect Bilirubin: 1.4 mg/dL — ABNORMAL HIGH (ref 0.3–0.9)
Total Bilirubin: 3.6 mg/dL — ABNORMAL HIGH (ref 0.3–1.2)

## 2010-12-29 LAB — HEPATITIS PANEL, ACUTE
HCV Ab: REACTIVE — AB
Hep A IgM: NEGATIVE
Hepatitis B Surface Ag: NEGATIVE

## 2010-12-29 LAB — PROTIME-INR
INR: 1.89 — ABNORMAL HIGH (ref 0.00–1.49)
INR: 2.31 — ABNORMAL HIGH (ref 0.00–1.49)
Prothrombin Time: 21.3 seconds — ABNORMAL HIGH (ref 11.6–15.2)
Prothrombin Time: 21.5 seconds — ABNORMAL HIGH (ref 11.6–15.2)
Prothrombin Time: 25.2 seconds — ABNORMAL HIGH (ref 11.6–15.2)

## 2010-12-29 LAB — CLOSTRIDIUM DIFFICILE EIA

## 2010-12-29 LAB — CBC
HCT: 28.5 % — ABNORMAL LOW (ref 36.0–46.0)
HCT: 29.3 % — ABNORMAL LOW (ref 36.0–46.0)
HCT: 29.5 % — ABNORMAL LOW (ref 36.0–46.0)
HCT: 34 % — ABNORMAL LOW (ref 36.0–46.0)
Hemoglobin: 10 g/dL — ABNORMAL LOW (ref 12.0–15.0)
Hemoglobin: 10.3 g/dL — ABNORMAL LOW (ref 12.0–15.0)
Hemoglobin: 10.6 g/dL — ABNORMAL LOW (ref 12.0–15.0)
Hemoglobin: 10.8 g/dL — ABNORMAL LOW (ref 12.0–15.0)
Hemoglobin: 10.9 g/dL — ABNORMAL LOW (ref 12.0–15.0)
MCHC: 34.4 g/dL (ref 30.0–36.0)
MCHC: 34.5 g/dL (ref 30.0–36.0)
MCHC: 34.7 g/dL (ref 30.0–36.0)
MCHC: 34.8 g/dL (ref 30.0–36.0)
MCHC: 34.9 g/dL (ref 30.0–36.0)
MCHC: 35 g/dL (ref 30.0–36.0)
MCHC: 35.2 g/dL (ref 30.0–36.0)
MCV: 100.2 fL — ABNORMAL HIGH (ref 78.0–100.0)
MCV: 100.5 fL — ABNORMAL HIGH (ref 78.0–100.0)
MCV: 101.5 fL — ABNORMAL HIGH (ref 78.0–100.0)
MCV: 101.8 fL — ABNORMAL HIGH (ref 78.0–100.0)
MCV: 102 fL — ABNORMAL HIGH (ref 78.0–100.0)
MCV: 102.8 fL — ABNORMAL HIGH (ref 78.0–100.0)
Platelets: 26 10*3/uL — CL (ref 150–400)
Platelets: 36 10*3/uL — ABNORMAL LOW (ref 150–400)
Platelets: 65 10*3/uL — ABNORMAL LOW (ref 150–400)
RBC: 2.84 MIL/uL — ABNORMAL LOW (ref 3.87–5.11)
RBC: 2.89 MIL/uL — ABNORMAL LOW (ref 3.87–5.11)
RBC: 2.94 MIL/uL — ABNORMAL LOW (ref 3.87–5.11)
RBC: 2.94 MIL/uL — ABNORMAL LOW (ref 3.87–5.11)
RBC: 3.01 MIL/uL — ABNORMAL LOW (ref 3.87–5.11)
RBC: 3.04 MIL/uL — ABNORMAL LOW (ref 3.87–5.11)
RBC: 3.05 MIL/uL — ABNORMAL LOW (ref 3.87–5.11)
RBC: 3.4 MIL/uL — ABNORMAL LOW (ref 3.87–5.11)
RDW: 15.6 % — ABNORMAL HIGH (ref 11.5–15.5)
RDW: 15.7 % — ABNORMAL HIGH (ref 11.5–15.5)
RDW: 15.7 % — ABNORMAL HIGH (ref 11.5–15.5)
RDW: 15.8 % — ABNORMAL HIGH (ref 11.5–15.5)
RDW: 16 % — ABNORMAL HIGH (ref 11.5–15.5)
WBC: 10.7 10*3/uL — ABNORMAL HIGH (ref 4.0–10.5)
WBC: 12.1 10*3/uL — ABNORMAL HIGH (ref 4.0–10.5)
WBC: 27.7 10*3/uL — ABNORMAL HIGH (ref 4.0–10.5)
WBC: 28.2 10*3/uL — ABNORMAL HIGH (ref 4.0–10.5)

## 2010-12-29 LAB — URINALYSIS, ROUTINE W REFLEX MICROSCOPIC
Glucose, UA: NEGATIVE mg/dL
Nitrite: POSITIVE — AB
Protein, ur: 100 mg/dL — AB

## 2010-12-29 LAB — LEGIONELLA ANTIGEN, URINE
Legionella Antigen, Urine: NEGATIVE
Legionella Antigen, Urine: NEGATIVE

## 2010-12-29 LAB — GLUCOSE, CAPILLARY
Glucose-Capillary: 106 mg/dL — ABNORMAL HIGH (ref 70–99)
Glucose-Capillary: 108 mg/dL — ABNORMAL HIGH (ref 70–99)
Glucose-Capillary: 138 mg/dL — ABNORMAL HIGH (ref 70–99)
Glucose-Capillary: 155 mg/dL — ABNORMAL HIGH (ref 70–99)
Glucose-Capillary: 60 mg/dL — ABNORMAL LOW (ref 70–99)
Glucose-Capillary: 77 mg/dL (ref 70–99)
Glucose-Capillary: 81 mg/dL (ref 70–99)

## 2010-12-29 LAB — COMPREHENSIVE METABOLIC PANEL
ALT: 30 U/L (ref 0–35)
ALT: 38 U/L — ABNORMAL HIGH (ref 0–35)
ALT: 43 U/L — ABNORMAL HIGH (ref 0–35)
ALT: 51 U/L — ABNORMAL HIGH (ref 0–35)
AST: 123 U/L — ABNORMAL HIGH (ref 0–37)
AST: 142 U/L — ABNORMAL HIGH (ref 0–37)
AST: 46 U/L — ABNORMAL HIGH (ref 0–37)
Albumin: 1.6 g/dL — ABNORMAL LOW (ref 3.5–5.2)
Albumin: 2.4 g/dL — ABNORMAL LOW (ref 3.5–5.2)
Alkaline Phosphatase: 126 U/L — ABNORMAL HIGH (ref 39–117)
Alkaline Phosphatase: 141 U/L — ABNORMAL HIGH (ref 39–117)
Alkaline Phosphatase: 154 U/L — ABNORMAL HIGH (ref 39–117)
BUN: 23 mg/dL (ref 6–23)
BUN: 40 mg/dL — ABNORMAL HIGH (ref 6–23)
BUN: 47 mg/dL — ABNORMAL HIGH (ref 6–23)
BUN: 53 mg/dL — ABNORMAL HIGH (ref 6–23)
CO2: 18 mEq/L — ABNORMAL LOW (ref 19–32)
CO2: 20 mEq/L (ref 19–32)
CO2: 21 mEq/L (ref 19–32)
CO2: 24 mEq/L (ref 19–32)
Calcium: 7 mg/dL — ABNORMAL LOW (ref 8.4–10.5)
Calcium: 7.1 mg/dL — ABNORMAL LOW (ref 8.4–10.5)
Calcium: 7.5 mg/dL — ABNORMAL LOW (ref 8.4–10.5)
Calcium: 7.8 mg/dL — ABNORMAL LOW (ref 8.4–10.5)
Chloride: 104 mEq/L (ref 96–112)
Chloride: 107 mEq/L (ref 96–112)
Chloride: 108 mEq/L (ref 96–112)
Creatinine, Ser: 0.96 mg/dL (ref 0.4–1.2)
Creatinine, Ser: 1.2 mg/dL (ref 0.4–1.2)
Creatinine, Ser: 2.92 mg/dL — ABNORMAL HIGH (ref 0.4–1.2)
GFR calc Af Amer: 57 mL/min — ABNORMAL LOW (ref >60)
GFR calc Af Amer: >60 mL/min (ref >60)
GFR calc non Af Amer: 20 mL/min — ABNORMAL LOW (ref >60)
GFR calc non Af Amer: 47 mL/min — ABNORMAL LOW (ref >60)
GFR calc non Af Amer: 57 mL/min — ABNORMAL LOW (ref >60)
Glucose, Bld: 102 mg/dL — ABNORMAL HIGH (ref 70–99)
Glucose, Bld: 116 mg/dL — ABNORMAL HIGH (ref 70–99)
Glucose, Bld: 59 mg/dL — ABNORMAL LOW (ref 70–99)
Potassium: 3.4 mEq/L — ABNORMAL LOW (ref 3.5–5.1)
Potassium: 4 mEq/L (ref 3.5–5.1)
Sodium: 133 mEq/L — ABNORMAL LOW (ref 135–145)
Sodium: 133 mEq/L — ABNORMAL LOW (ref 135–145)
Sodium: 137 mEq/L (ref 135–145)
Total Bilirubin: 6.7 mg/dL — ABNORMAL HIGH (ref 0.3–1.2)
Total Bilirubin: 7.6 mg/dL — ABNORMAL HIGH (ref 0.3–1.2)
Total Protein: 5.6 g/dL — ABNORMAL LOW (ref 6.0–8.3)
Total Protein: 5.6 g/dL — ABNORMAL LOW (ref 6.0–8.3)
Total Protein: 5.9 g/dL — ABNORMAL LOW (ref 6.0–8.3)
Total Protein: 6.6 g/dL (ref 6.0–8.3)

## 2010-12-29 LAB — DIFFERENTIAL
Basophils Absolute: 0 10*3/uL (ref 0.0–0.1)
Basophils Absolute: 0 10*3/uL (ref 0.0–0.1)
Basophils Absolute: 0.1 10*3/uL (ref 0.0–0.1)
Basophils Absolute: 0.3 10*3/uL — ABNORMAL HIGH (ref 0.0–0.1)
Basophils Relative: 0 % (ref 0–1)
Basophils Relative: 0 % (ref 0–1)
Basophils Relative: 0 % (ref 0–1)
Basophils Relative: 1 % (ref 0–1)
Basophils Relative: 1 % (ref 0–1)
Eosinophils Absolute: 0 10*3/uL (ref 0.0–0.7)
Eosinophils Absolute: 0.1 10*3/uL (ref 0.0–0.7)
Eosinophils Absolute: 0.1 10*3/uL (ref 0.0–0.7)
Eosinophils Absolute: 0.1 10*3/uL (ref 0.0–0.7)
Eosinophils Absolute: 0.3 10*3/uL (ref 0.0–0.7)
Eosinophils Relative: 1 % (ref 0–5)
Eosinophils Relative: 1 % (ref 0–5)
Eosinophils Relative: 2 % (ref 0–5)
Eosinophils Relative: 3 % (ref 0–5)
Lymphocytes Relative: 12 % (ref 12–46)
Lymphocytes Relative: 13 % (ref 12–46)
Lymphocytes Relative: 15 % (ref 12–46)
Lymphocytes Relative: 3 % — ABNORMAL LOW (ref 12–46)
Lymphocytes Relative: 5 % — ABNORMAL LOW (ref 12–46)
Lymphs Abs: 1.3 10*3/uL (ref 0.7–4.0)
Lymphs Abs: 1.5 10*3/uL (ref 0.7–4.0)
Lymphs Abs: 1.7 10*3/uL (ref 0.7–4.0)
Lymphs Abs: 2 10*3/uL (ref 0.7–4.0)
Monocytes Absolute: 0.1 10*3/uL (ref 0.1–1.0)
Monocytes Absolute: 0.2 10*3/uL (ref 0.1–1.0)
Monocytes Absolute: 0.8 10*3/uL (ref 0.1–1.0)
Monocytes Relative: 1 % — ABNORMAL LOW (ref 3–12)
Monocytes Relative: 1 % — ABNORMAL LOW (ref 3–12)
Monocytes Relative: 10 % (ref 3–12)
Monocytes Relative: 3 % (ref 3–12)
Monocytes Relative: 5 % (ref 3–12)
Monocytes Relative: 5 % (ref 3–12)
Neutro Abs: 16.8 10*3/uL — ABNORMAL HIGH (ref 1.7–7.7)
Neutro Abs: 25.9 10*3/uL — ABNORMAL HIGH (ref 1.7–7.7)
Neutro Abs: 26.8 10*3/uL — ABNORMAL HIGH (ref 1.7–7.7)
Neutro Abs: 7.8 10*3/uL — ABNORMAL HIGH (ref 1.7–7.7)
Neutro Abs: 9.8 10*3/uL — ABNORMAL HIGH (ref 1.7–7.7)
Neutrophils Relative %: 76 % (ref 43–77)
Neutrophils Relative %: 91 % — ABNORMAL HIGH (ref 43–77)
Neutrophils Relative %: 93 % — ABNORMAL HIGH (ref 43–77)
Neutrophils Relative %: 95 % — ABNORMAL HIGH (ref 43–77)
Smear Review: DECREASED
WBC Morphology: INCREASED

## 2010-12-29 LAB — ALPHA-1-ANTITRYPSIN: A-1 Antitrypsin, Ser: 262 mg/dL — ABNORMAL HIGH (ref 83–200)

## 2010-12-29 LAB — IRON AND TIBC
Iron: 63 ug/dL (ref 42–135)
Saturation Ratios: 50 % (ref 20–55)

## 2010-12-29 LAB — URINE MICROSCOPIC-ADD ON

## 2010-12-29 LAB — CULTURE, BLOOD (ROUTINE X 2)
Culture: NO GROWTH
Report Status: 4112011
Report Status: 4112011

## 2010-12-29 LAB — HCV RNA QUANT: HCV Quantitative: 689000 IU/mL — ABNORMAL HIGH (ref <43)

## 2010-12-29 LAB — BLOOD GAS, ARTERIAL
Acid-base deficit: 6.3 mmol/L — ABNORMAL HIGH (ref 0.0–2.0)
Bicarbonate: 18.1 mEq/L — ABNORMAL LOW (ref 20.0–24.0)
O2 Saturation: 95 %
TCO2: 17 mmol/L (ref 0–100)
pCO2 arterial: 32.9 mmHg — ABNORMAL LOW (ref 35.0–45.0)
pO2, Arterial: 80.8 mmHg (ref 80.0–100.0)

## 2010-12-29 LAB — OVA AND PARASITE EXAMINATION

## 2010-12-29 LAB — CULTURE, RESPIRATORY W GRAM STAIN

## 2010-12-29 LAB — AMYLASE: Amylase: 24 U/L (ref 0–105)

## 2010-12-29 LAB — LACTIC ACID, PLASMA
Lactic Acid, Venous: 3 mmol/L — ABNORMAL HIGH (ref 0.5–2.2)
Lactic Acid, Venous: 3.7 mmol/L — ABNORMAL HIGH (ref 0.5–2.2)

## 2010-12-29 LAB — STOOL CULTURE

## 2010-12-29 LAB — APTT: aPTT: 31 seconds (ref 24–37)

## 2010-12-29 LAB — EXPECTORATED SPUTUM ASSESSMENT W GRAM STAIN, RFLX TO RESP C

## 2010-12-29 LAB — RETICULOCYTES
RBC.: 3.09 MIL/uL — ABNORMAL LOW (ref 3.87–5.11)
Retic Count, Absolute: 74.2 10*3/uL (ref 19.0–186.0)

## 2010-12-29 LAB — CERULOPLASMIN: Ceruloplasmin: 23 mg/dL (ref 21–63)

## 2010-12-29 LAB — VANCOMYCIN, RANDOM: Vancomycin Rm: 13.2 ug/mL

## 2010-12-29 LAB — HEPATITIS B SURFACE ANTIBODY,QUALITATIVE: Hep B S Ab: NEGATIVE

## 2010-12-29 LAB — HEPATIC FUNCTION PANEL
ALT: 45 U/L — ABNORMAL HIGH (ref 0–35)
Albumin: 1.6 g/dL — ABNORMAL LOW (ref 3.5–5.2)
Alkaline Phosphatase: 129 U/L — ABNORMAL HIGH (ref 39–117)
Indirect Bilirubin: 2 mg/dL — ABNORMAL HIGH (ref 0.3–0.9)
Total Protein: 5.4 g/dL — ABNORMAL LOW (ref 6.0–8.3)

## 2010-12-29 LAB — HEMOCCULT GUIAC POC 1CARD (OFFICE): Fecal Occult Bld: POSITIVE

## 2010-12-29 LAB — T4, FREE: Free T4: 1 ng/dL (ref 0.80–1.80)

## 2010-12-29 LAB — IGG, IGA, IGM
IgA: <7 mg/dL — ABNORMAL LOW (ref 68–378)
IgG (Immunoglobin G), Serum: 1890 mg/dL — ABNORMAL HIGH (ref 694–1618)

## 2010-12-29 LAB — URINE CULTURE

## 2011-03-24 IMAGING — CT CT CHEST W/O CM
2 of 3 series · 15 of 36 positions shown, 18 images · non-contrast
Comparison: None
Correlation:  Chest radiograph 01/22/2010

CLINICAL DATA: Abnormal chest x-ray, infiltrate, pleural effusion,
cholecystitis, jaundice, fever, history hepatitis C

CT CHEST WITHOUT CONTRAST
TECHNIQUE: Multidetector CT imaging of the chest was performed
following the standard protocol without IV contrast. IV contrast
not utilized due to renal dysfunction, creatinine 2.14.

[Series 2: chestroutine 5.0 b40f · axial · 0.66mm/px · z∈[-320,-75]mm · 12 of 59 slices shown, 15 images]
[im 5/59  mediastinal]
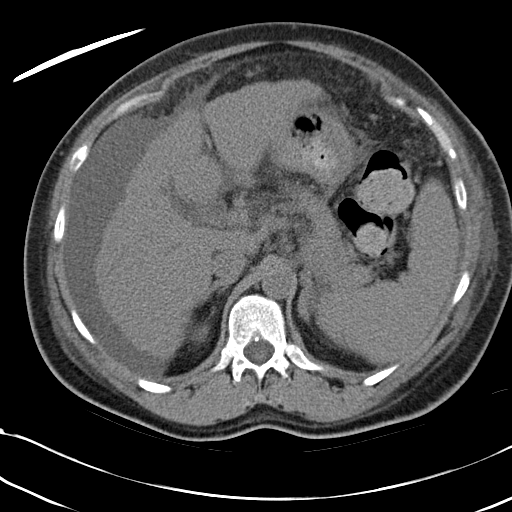
[im 5/59  lung]
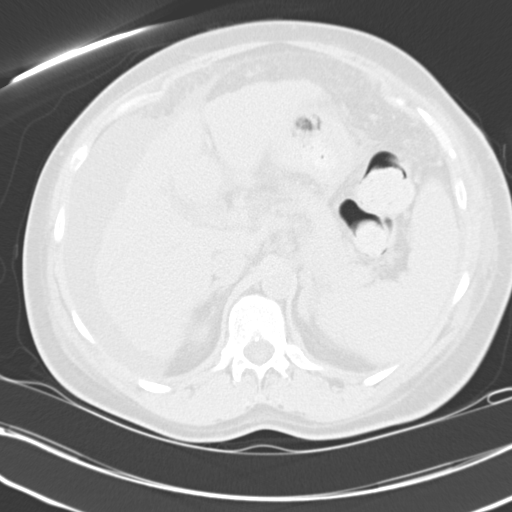
[im 9/59  lung]
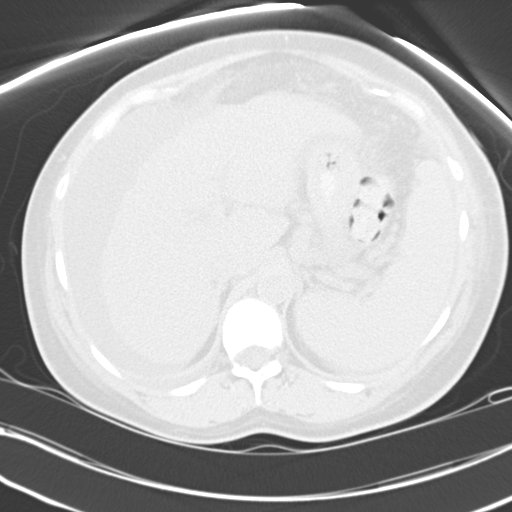
[im 13/59  lung]
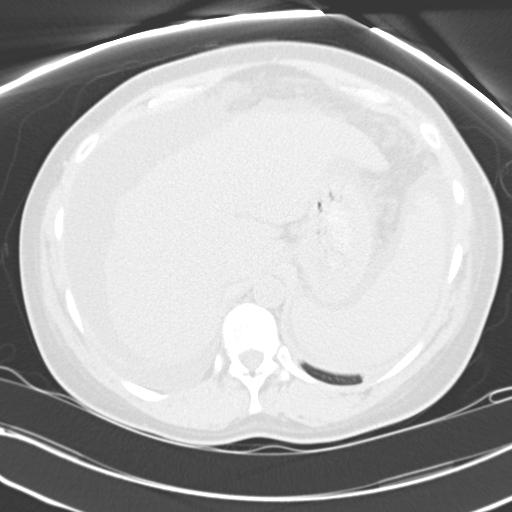
[im 18/59  lung]
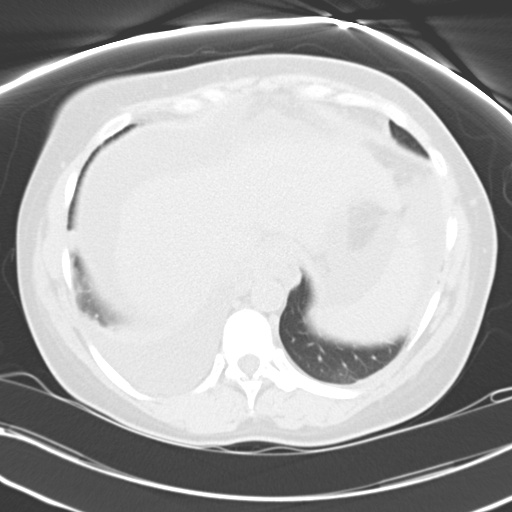
[im 22/59  mediastinal]
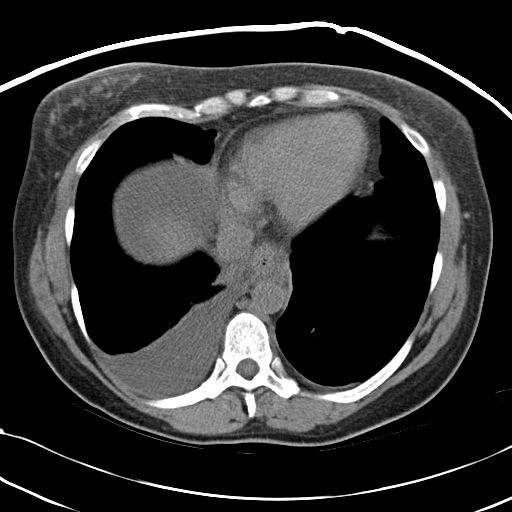
[im 22/59  lung]
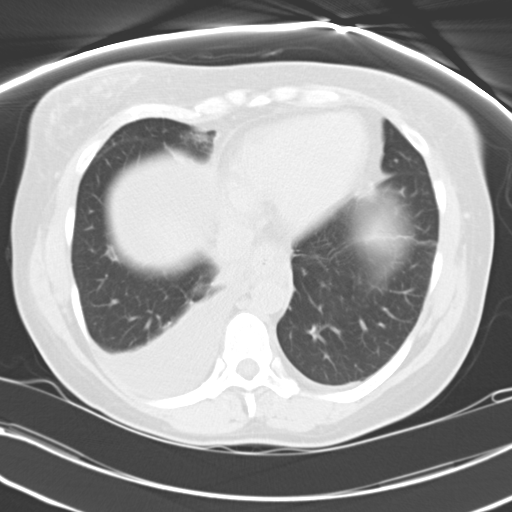
[im 26/59  lung]
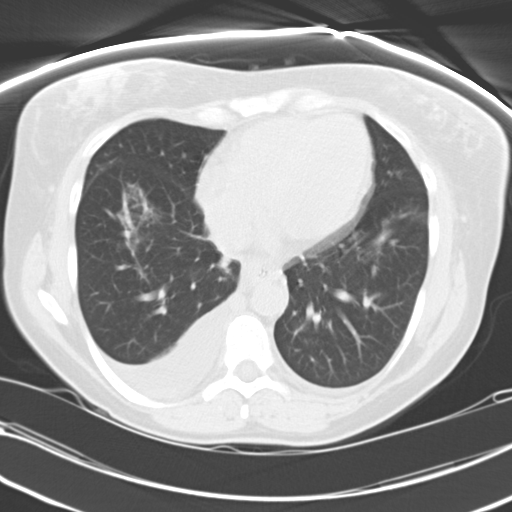
[im 33/59  lung]
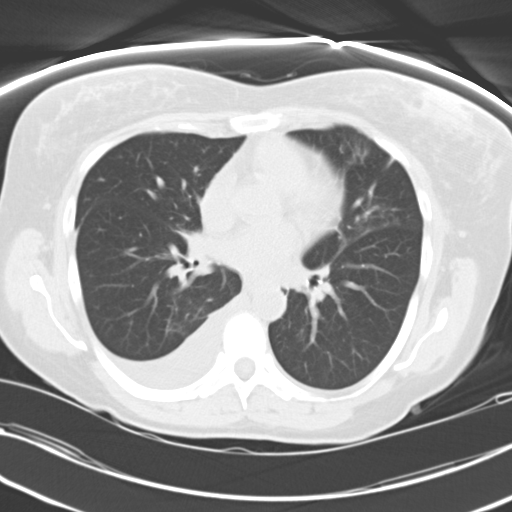
[im 37/59  lung]
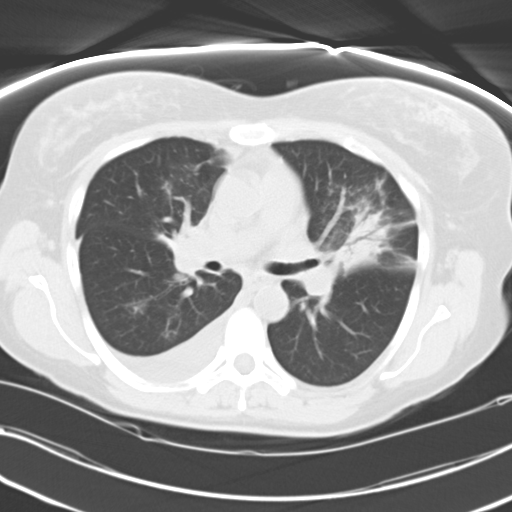
[im 41/59  mediastinal]
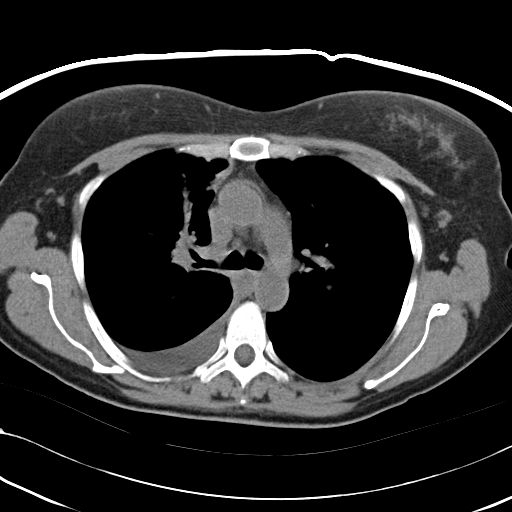
[im 41/59  lung]
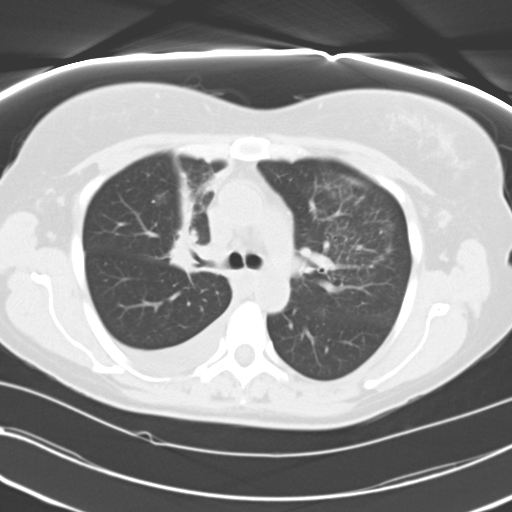
[im 46/59  lung]
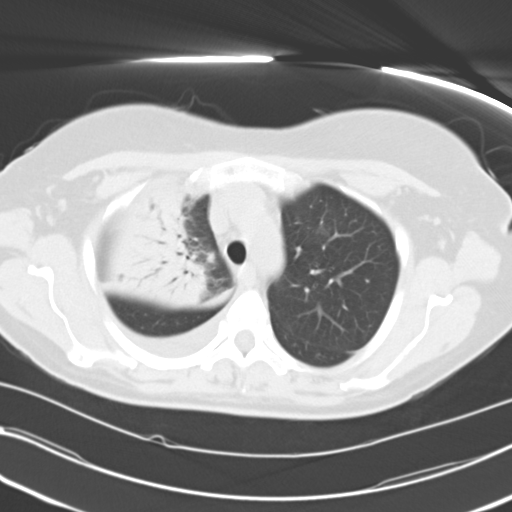
[im 50/59  lung]
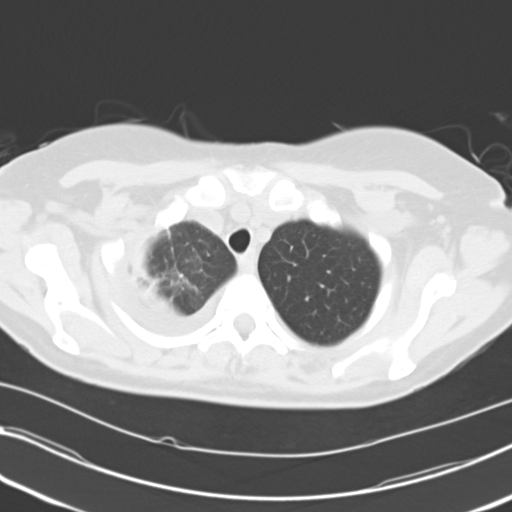
[im 54/59  lung]
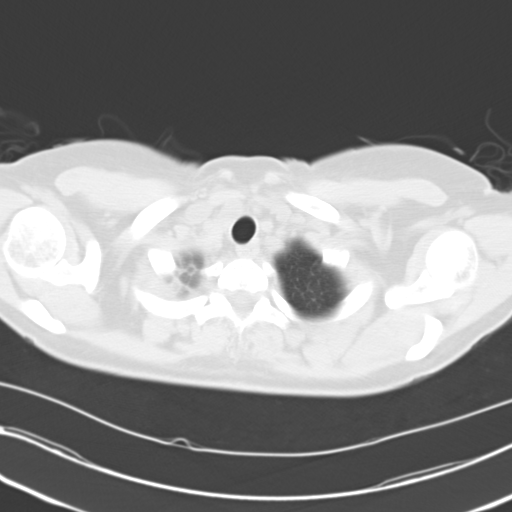

[Series 4: mpr coro 3mm · coronal · 0.57mm/px · 3 of 84 slices shown]
[im 17/84  lung]
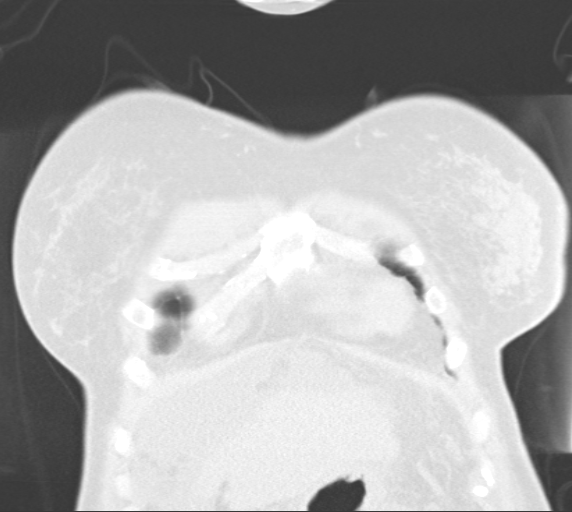
[im 34/84  lung]
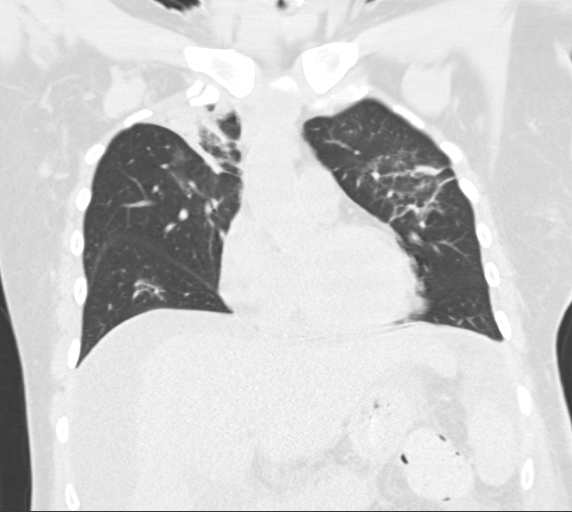
[im 50/84  lung]
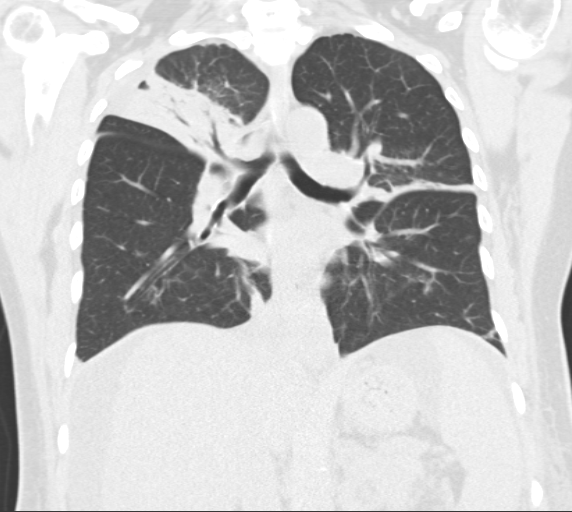

[15 of 36 positions shown; findings below may reference images not displayed]

FINDINGS: Aorta normal caliber.
Normal sized right paratracheal precarinal lymph nodes.
Hila less well visualized due to lack of IV contrast but right
hilum appears mildly enlarged, suspect small lymph nodes at right
pulmonary hilum.
No axillary adenopathy.
Nodular appearing liver compatible with cirrhosis.
Splenomegaly with probable small left upper quadrant varices.
Moderate upper abdominal ascites.
Small to moderate right pleural effusion.
Extensive infiltrates identified in the apical segment of right
upper lobe and in lingula.
Foci of central infiltrate are identified at anterior basal segment
and apical segment of right lower lobe.
Minimal compressive atelectasis of posterior right lower lobe.
No definite pulmonary mass or nodule.
Right upper lobe volume loss with superior retraction of the right
hilum.
No acute bony findings.
IMPRESSION: Cirrhotic liver with splenomegaly, ascites, and probable left upper
quadrant varices.
Pulmonary infiltrates are identified right upper lobe, lingula, and
to a lesser degree scattered in right lower lobe.
Small to moderate right pleural effusion.
No definite pulmonary mass.

## 2011-04-08 IMAGING — CT CT HEAD W/O CM
1 series · 16 of 30 positions shown, 20 images · non-contrast
Comparison: None

CLINICAL DATA: Altered mental status.

CT HEAD WITHOUT CONTRAST
TECHNIQUE: Contiguous axial images were obtained from the base of
the skull through the vertex without contrast.

[Series 3: headseq 4.8 h37s · axial · 0.43mm/px · z∈[+72,+229]mm · 16 of 36 slices shown, 20 images]
[im 2/36  brain]
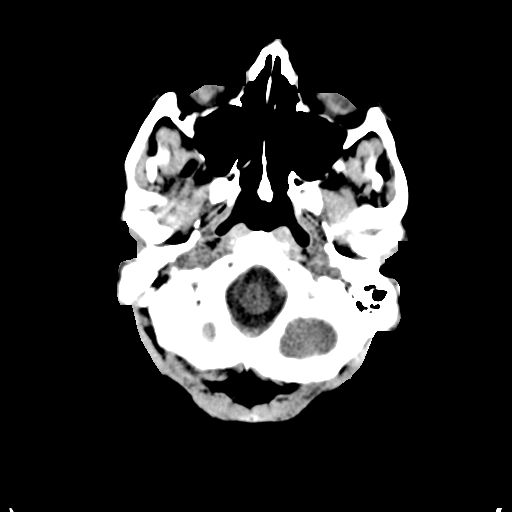
[im 2/36  bone]
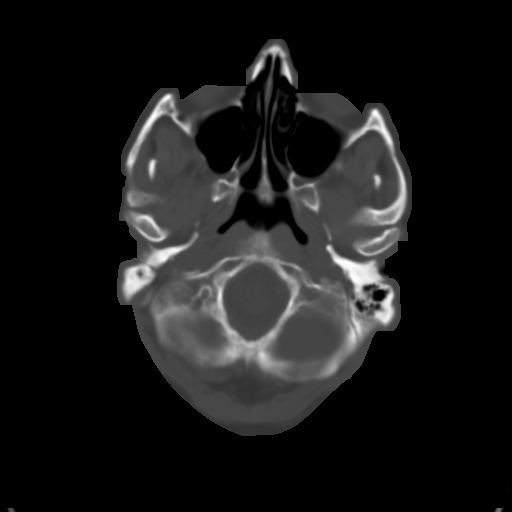
[im 4/36  brain]
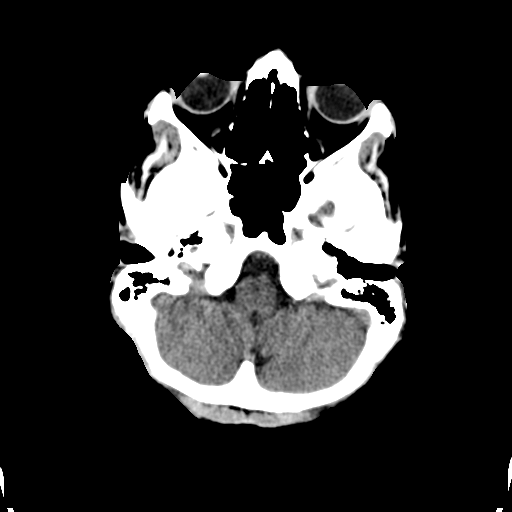
[im 7/36  brain]
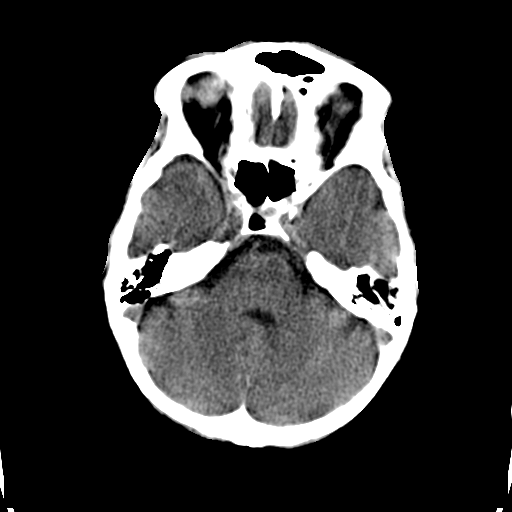
[im 9/36  brain]
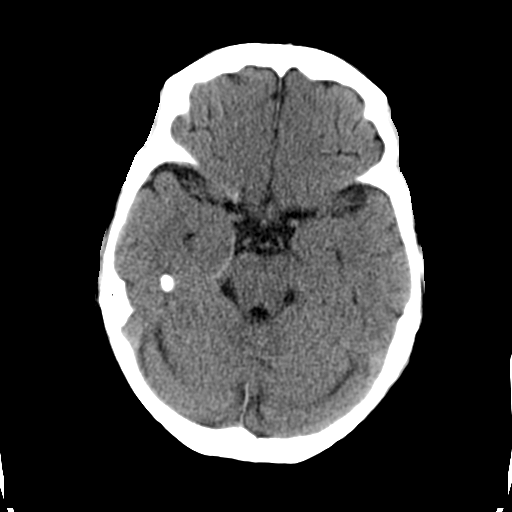
[im 10/36  brain]
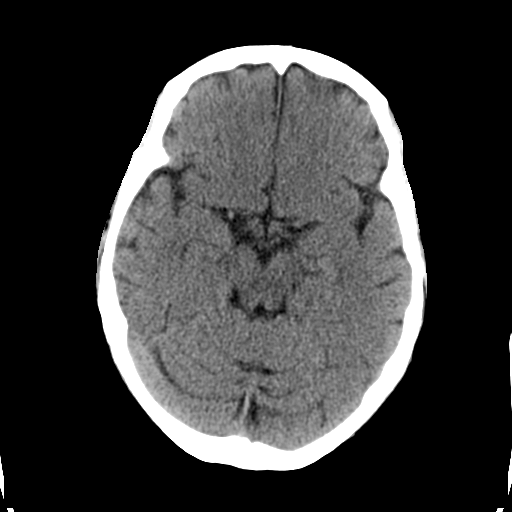
[im 10/36  bone]
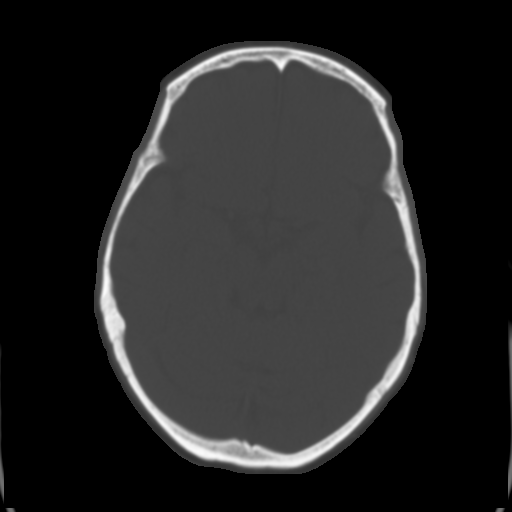
[im 13/36  brain]
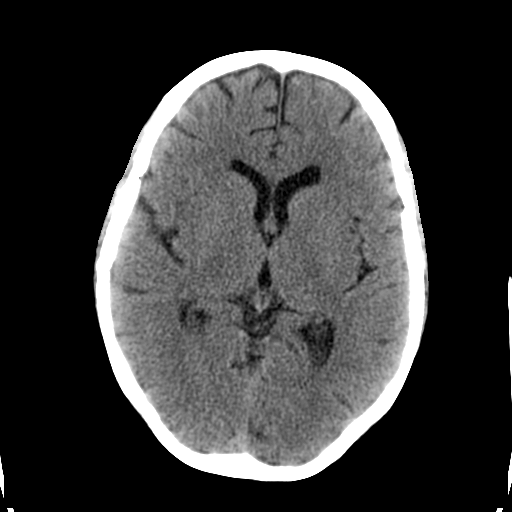
[im 15/36  brain]
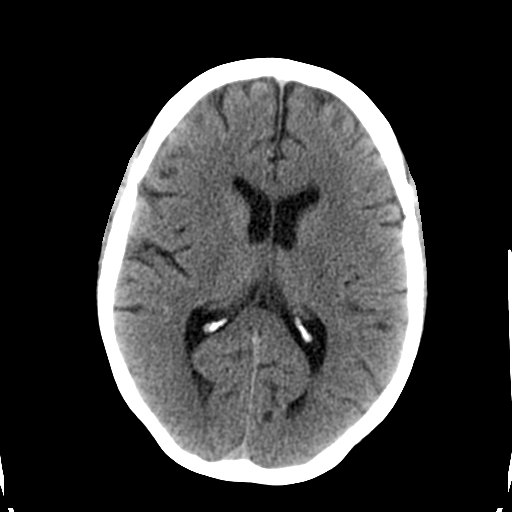
[im 17/36  brain]
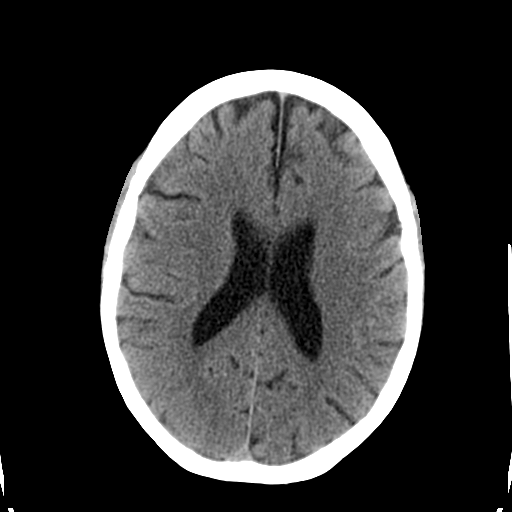
[im 19/36  brain]
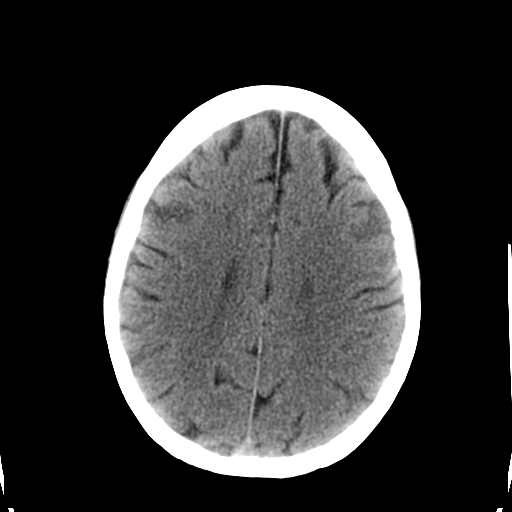
[im 19/36  bone]
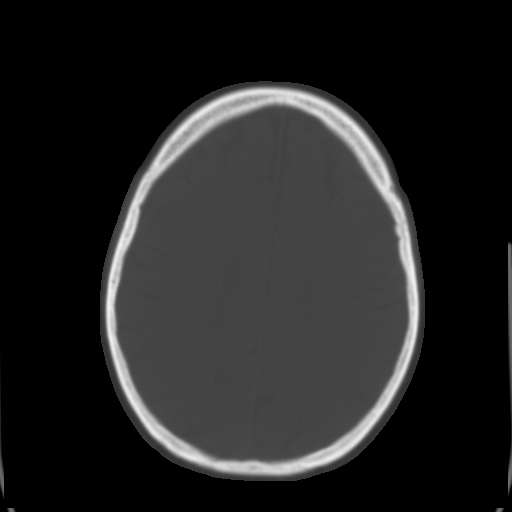
[im 21/36  brain]
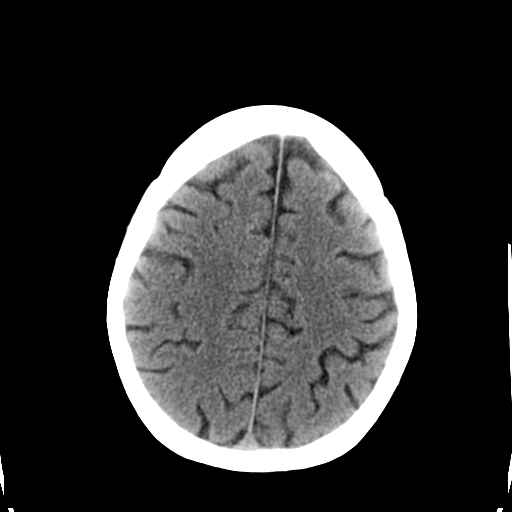
[im 23/36  brain]
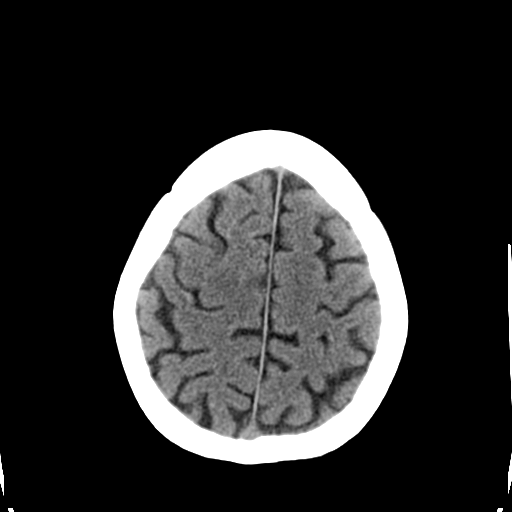
[im 26/36  brain]
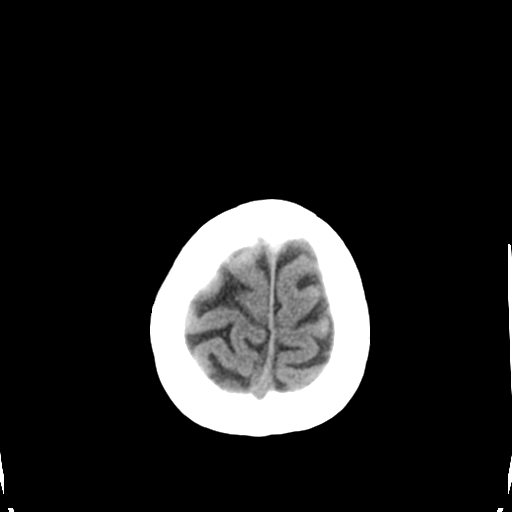
[im 27/36  brain]
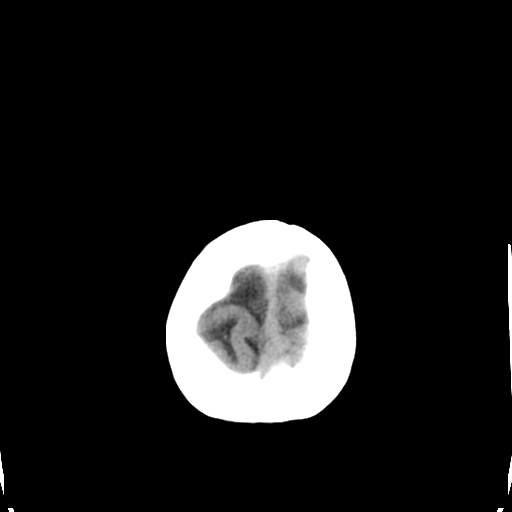
[im 27/36  bone]
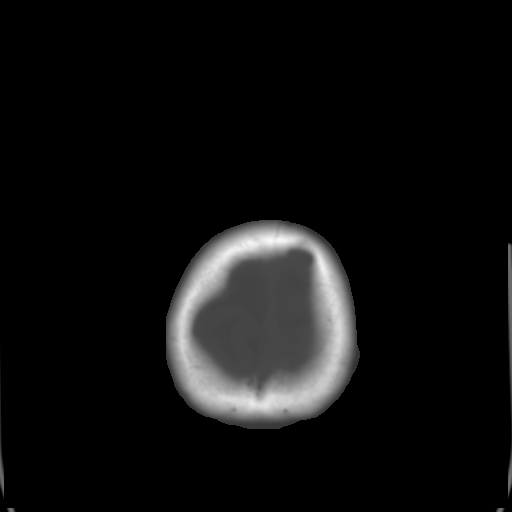
[im 29/36  brain]
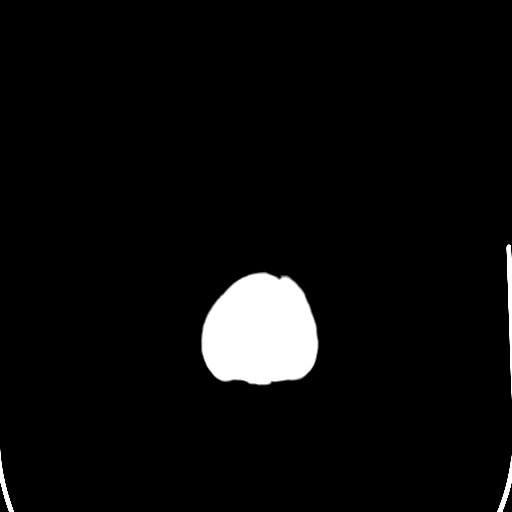
[im 32/36  brain]
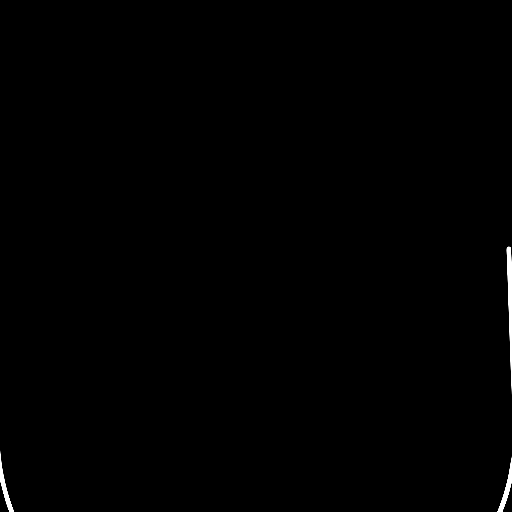
[im 34/36  brain]
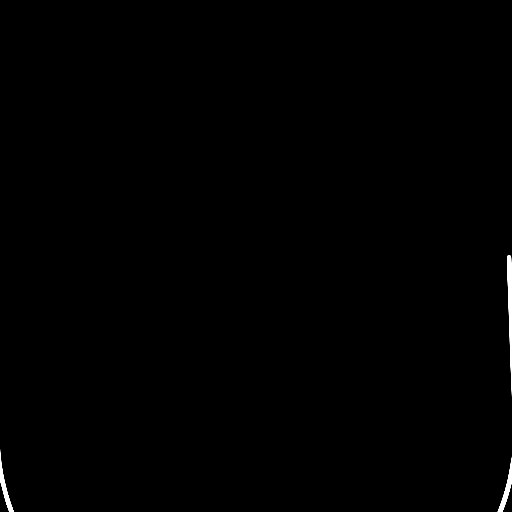

[16 of 30 positions shown; findings below may reference images not displayed]

FINDINGS: No acute intracranial abnormality.  Specifically, no
hemorrhage, hydrocephalus, mass lesion, acute infarction, or
significant intracranial injury.  No acute calvarial abnormality.

Visualized paranasal sinuses and mastoids clear.  Orbital soft
tissues unremarkable.
IMPRESSION: Normal study.

## 2011-04-08 IMAGING — CR DG CHEST 1V
1 series · 1 of 1 positions shown · non-contrast
Comparison: Chest radiograph 01/22/2010

CLINICAL DATA: Altered mental status, vomiting

CHEST - 1 VIEW

[view not recorded]
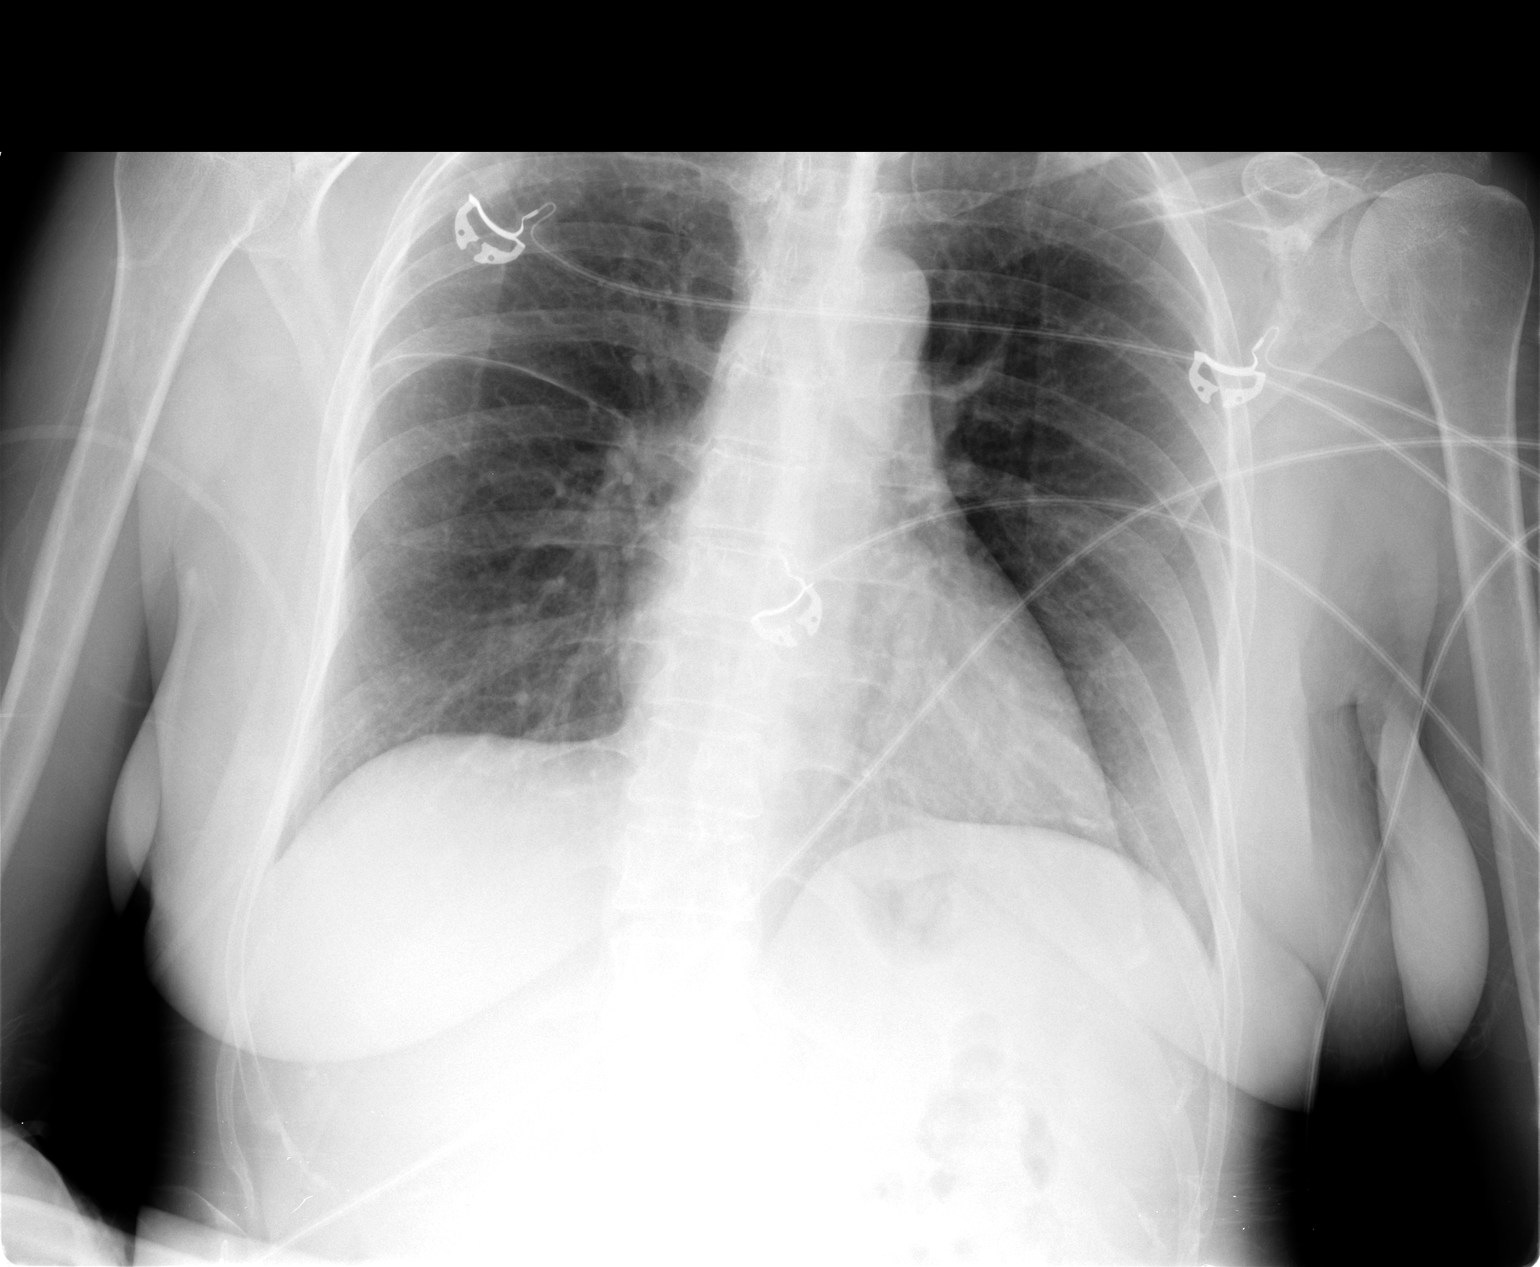

[1 of 1 positions shown; findings below may reference images not displayed]

FINDINGS: Normal mediastinum and cardiac silhouette.  Interval
clearing of right upper lobe pneumonia seen on comparison study.
No evidence effusion, infiltrate, or pneumothorax.  No acute bony
abnormality.
IMPRESSION: No acute cardiopulmonary process.

Resolution of right upper lobe pneumonia.

## 2017-05-18 ENCOUNTER — Ambulatory Visit (HOSPITAL_COMMUNITY)
Admission: RE | Admit: 2017-05-18 | Discharge: 2017-05-18 | Disposition: A | Discharge status: Home / Self Care | Payer: Medicare Other | Source: Ambulatory Visit | Attending: Orthopedic Surgery | Admitting: Orthopedic Surgery

## 2017-05-18 ENCOUNTER — Encounter (HOSPITAL_COMMUNITY): Payer: Self-pay | Admitting: *Deleted

## 2017-05-18 ENCOUNTER — Inpatient Hospital Stay (HOSPITAL_COMMUNITY): Payer: Medicare Other | Admitting: Certified Registered"

## 2017-05-18 ENCOUNTER — Encounter (HOSPITAL_COMMUNITY)
Admission: RE | Disposition: A | Discharge status: Home / Self Care | Payer: Self-pay | Source: Ambulatory Visit | Attending: Orthopedic Surgery

## 2017-05-18 DIAGNOSIS — Z79899 Other long term (current) drug therapy: Secondary | ICD-10-CM | POA: Insufficient documentation

## 2017-05-18 DIAGNOSIS — M71162 Other infective bursitis, left knee: Secondary | ICD-10-CM | POA: Diagnosis present

## 2017-05-18 DIAGNOSIS — F329 Major depressive disorder, single episode, unspecified: Secondary | ICD-10-CM | POA: Diagnosis not present

## 2017-05-18 HISTORY — DX: Other infective bursitis, left knee: M71.162

## 2017-05-18 HISTORY — PX: I & D EXTREMITY: SHX5045

## 2017-05-18 HISTORY — PX: I&D EXTREMITY: SHX5045

## 2017-05-18 HISTORY — DX: Anxiety disorder, unspecified: F41.9

## 2017-05-18 HISTORY — DX: Unspecified cirrhosis of liver: K74.60

## 2017-05-18 HISTORY — DX: Generalized edema: R60.1

## 2017-05-18 HISTORY — DX: Inflammatory liver disease, unspecified: K75.9

## 2017-05-18 HISTORY — DX: Unspecified viral hepatitis C without hepatic coma: B19.20

## 2017-05-18 HISTORY — DX: Anemia, unspecified: D64.9

## 2017-05-18 HISTORY — DX: Depression, unspecified: F32.A

## 2017-05-18 HISTORY — DX: Major depressive disorder, single episode, unspecified: F32.9

## 2017-05-18 HISTORY — DX: Pneumonia, unspecified organism: J18.9

## 2017-05-18 LAB — COMPREHENSIVE METABOLIC PANEL
ALBUMIN: 3.1 g/dL — AB (ref 3.5–5.0)
ALT: 15 U/L (ref 14–54)
ANION GAP: 6 (ref 5–15)
AST: 30 U/L (ref 15–41)
Alkaline Phosphatase: 79 U/L (ref 38–126)
BUN: 15 mg/dL (ref 6–20)
CHLORIDE: 110 mmol/L (ref 101–111)
CO2: 23 mmol/L (ref 22–32)
Calcium: 8.3 mg/dL — ABNORMAL LOW (ref 8.9–10.3)
Creatinine, Ser: 1.06 mg/dL — ABNORMAL HIGH (ref 0.44–1.00)
GFR calc non Af Amer: 55 mL/min — ABNORMAL LOW (ref >60)
GLUCOSE: 92 mg/dL (ref 65–99)
Potassium: 4 mmol/L (ref 3.5–5.1)
SODIUM: 139 mmol/L (ref 135–145)
Total Bilirubin: 2.7 mg/dL — ABNORMAL HIGH (ref 0.3–1.2)
Total Protein: 6.4 g/dL — ABNORMAL LOW (ref 6.5–8.1)

## 2017-05-18 LAB — CBC
HCT: 32.4 % — ABNORMAL LOW (ref 36.0–46.0)
Hemoglobin: 11.2 g/dL — ABNORMAL LOW (ref 12.0–15.0)
MCH: 31.4 pg (ref 26.0–34.0)
MCHC: 34.6 g/dL (ref 30.0–36.0)
MCV: 90.8 fL (ref 78.0–100.0)
PLATELETS: 71 10*3/uL — AB (ref 150–400)
RBC: 3.57 MIL/uL — AB (ref 3.87–5.11)
RDW: 16.2 % — ABNORMAL HIGH (ref 11.5–15.5)
WBC: 6.8 10*3/uL (ref 4.0–10.5)

## 2017-05-18 SURGERY — IRRIGATION AND DEBRIDEMENT EXTREMITY
Anesthesia: General | Laterality: Left

## 2017-05-18 MED ORDER — PROMETHAZINE HCL 25 MG/ML IJ SOLN: 6.2500 mg | INTRAMUSCULAR | Status: DC | PRN

## 2017-05-18 MED ORDER — OXYCODONE HCL 5 MG PO TABS: 5.0000 mg | ORAL_TABLET | ORAL | 0 refills | Status: AC | PRN

## 2017-05-18 MED ORDER — VANCOMYCIN HCL IN DEXTROSE 1-5 GM/200ML-% IV SOLN
INTRAVENOUS | Status: AC
  Filled 2017-05-18: qty 200

## 2017-05-18 MED ORDER — MIDAZOLAM HCL 2 MG/2ML IJ SOLN
INTRAMUSCULAR | Status: DC | PRN
  Administered 2017-05-18: 1 mg via INTRAVENOUS
  Administered 2017-05-18: 2 mg via INTRAVENOUS
  Administered 2017-05-18: 1 mg via INTRAVENOUS

## 2017-05-18 MED ORDER — FENTANYL CITRATE (PF) 250 MCG/5ML IJ SOLN
INTRAMUSCULAR | Status: AC
  Filled 2017-05-18: qty 5

## 2017-05-18 MED ORDER — OXYCODONE HCL 5 MG/5ML PO SOLN: 5.0000 mg | Freq: Once | ORAL | Status: DC | PRN

## 2017-05-18 MED ORDER — FENTANYL CITRATE (PF) 100 MCG/2ML IJ SOLN
INTRAMUSCULAR | Status: AC
  Filled 2017-05-18: qty 2

## 2017-05-18 MED ORDER — CEFAZOLIN SODIUM-DEXTROSE 2-4 GM/100ML-% IV SOLN
2.0000 g | INTRAVENOUS | Status: AC
  Administered 2017-05-18: 2 g via INTRAVENOUS
  Filled 2017-05-18: qty 100

## 2017-05-18 MED ORDER — ONDANSETRON HCL 4 MG/2ML IJ SOLN
INTRAMUSCULAR | Status: AC
  Filled 2017-05-18: qty 2

## 2017-05-18 MED ORDER — DEXAMETHASONE SODIUM PHOSPHATE 10 MG/ML IJ SOLN
INTRAMUSCULAR | Status: AC
  Filled 2017-05-18: qty 1

## 2017-05-18 MED ORDER — LIDOCAINE 2% (20 MG/ML) 5 ML SYRINGE
INTRAMUSCULAR | Status: DC | PRN
  Administered 2017-05-18: 100 mg via INTRAVENOUS

## 2017-05-18 MED ORDER — BUPIVACAINE-EPINEPHRINE (PF) 0.5% -1:200000 IJ SOLN
INTRAMUSCULAR | Status: DC | PRN
  Administered 2017-05-18: 30 mL via PERINEURAL

## 2017-05-18 MED ORDER — PHENYLEPHRINE 40 MCG/ML (10ML) SYRINGE FOR IV PUSH (FOR BLOOD PRESSURE SUPPORT)
PREFILLED_SYRINGE | INTRAVENOUS | Status: DC | PRN
  Administered 2017-05-18: 160 ug via INTRAVENOUS
  Administered 2017-05-18 (×3): 80 ug via INTRAVENOUS

## 2017-05-18 MED ORDER — FENTANYL CITRATE (PF) 250 MCG/5ML IJ SOLN
INTRAMUSCULAR | Status: DC | PRN
  Administered 2017-05-18: 50 ug via INTRAVENOUS
  Administered 2017-05-18: 100 ug via INTRAVENOUS

## 2017-05-18 MED ORDER — PROPOFOL 10 MG/ML IV BOLUS
INTRAVENOUS | Status: DC | PRN
  Administered 2017-05-18: 200 mg via INTRAVENOUS

## 2017-05-18 MED ORDER — MIDAZOLAM HCL 2 MG/2ML IJ SOLN
INTRAMUSCULAR | Status: AC
  Filled 2017-05-18: qty 2

## 2017-05-18 MED ORDER — VANCOMYCIN HCL 1000 MG IV SOLR
INTRAVENOUS | Status: DC | PRN
  Administered 2017-05-18: 1000 mg via INTRAVENOUS

## 2017-05-18 MED ORDER — DEXAMETHASONE SODIUM PHOSPHATE 10 MG/ML IJ SOLN
INTRAMUSCULAR | Status: DC | PRN
  Administered 2017-05-18: 10 mg via INTRAVENOUS

## 2017-05-18 MED ORDER — LACTATED RINGERS IV SOLN
INTRAVENOUS | Status: DC
  Administered 2017-05-18: 12:00:00 via INTRAVENOUS

## 2017-05-18 MED ORDER — MEPERIDINE HCL 25 MG/ML IJ SOLN: 6.2500 mg | INTRAMUSCULAR | Status: DC | PRN

## 2017-05-18 MED ORDER — LACTATED RINGERS IV SOLN: INTRAVENOUS | Status: DC

## 2017-05-18 MED ORDER — ONDANSETRON HCL 4 MG/2ML IJ SOLN
INTRAMUSCULAR | Status: DC | PRN
  Administered 2017-05-18: 4 mg via INTRAVENOUS

## 2017-05-18 MED ORDER — SENNA-DOCUSATE SODIUM 8.6-50 MG PO TABS: 2.0000 | ORAL_TABLET | Freq: Every day | ORAL | 1 refills | Status: AC

## 2017-05-18 MED ORDER — PROPOFOL 10 MG/ML IV BOLUS
INTRAVENOUS | Status: AC
  Filled 2017-05-18: qty 20

## 2017-05-18 MED ORDER — PHENYLEPHRINE 40 MCG/ML (10ML) SYRINGE FOR IV PUSH (FOR BLOOD PRESSURE SUPPORT)
PREFILLED_SYRINGE | INTRAVENOUS | Status: AC
  Filled 2017-05-18: qty 10

## 2017-05-18 MED ORDER — SODIUM CHLORIDE 0.9 % IR SOLN
Status: DC | PRN
  Administered 2017-05-18: 3000 mL

## 2017-05-18 MED ORDER — OXYCODONE HCL 5 MG PO TABS: 5.0000 mg | ORAL_TABLET | Freq: Once | ORAL | Status: DC | PRN

## 2017-05-18 MED ORDER — HYDROMORPHONE HCL 1 MG/ML IJ SOLN: 0.2500 mg | INTRAMUSCULAR | Status: DC | PRN

## 2017-05-18 MED ORDER — AMOXICILLIN-POT CLAVULANATE 875-125 MG PO TABS: 1.0000 | ORAL_TABLET | Freq: Two times a day (BID) | ORAL | 0 refills | Status: AC

## 2017-05-18 SURGICAL SUPPLY — 43 items
BANDAGE ACE 4X5 VEL STRL LF (GAUZE/BANDAGES/DRESSINGS) IMPLANT
BANDAGE ACE 6X5 VEL STRL LF (GAUZE/BANDAGES/DRESSINGS) IMPLANT
BNDG COHESIVE 4X5 TAN STRL (GAUZE/BANDAGES/DRESSINGS) ×3 IMPLANT
BNDG COHESIVE 6X5 TAN STRL LF (GAUZE/BANDAGES/DRESSINGS) ×3 IMPLANT
BNDG GAUZE ELAST 4 BULKY (GAUZE/BANDAGES/DRESSINGS) ×6 IMPLANT
BOOTCOVER CLEANROOM LRG (PROTECTIVE WEAR) ×6 IMPLANT
COVER SURGICAL LIGHT HANDLE (MISCELLANEOUS) ×3 IMPLANT
CUFF TOURNIQUET SINGLE 34IN LL (TOURNIQUET CUFF) ×3 IMPLANT
DRAIN PENROSE 1/2X12 LTX STRL (WOUND CARE) ×3 IMPLANT
DRSG PAD ABDOMINAL 8X10 ST (GAUZE/BANDAGES/DRESSINGS) ×3 IMPLANT
DURAPREP 26ML APPLICATOR (WOUND CARE) ×3 IMPLANT
ELECT REM PT RETURN 9FT ADLT (ELECTROSURGICAL) ×3
ELECTRODE REM PT RTRN 9FT ADLT (ELECTROSURGICAL) ×1 IMPLANT
EVACUATOR 1/8 PVC DRAIN (DRAIN) IMPLANT
FACESHIELD WRAPAROUND (MASK) ×3 IMPLANT
GAUZE SPONGE 4X4 12PLY STRL (GAUZE/BANDAGES/DRESSINGS) ×3 IMPLANT
GAUZE XEROFORM 1X8 LF (GAUZE/BANDAGES/DRESSINGS) ×3 IMPLANT
GLOVE BIOGEL PI ORTHO PRO SZ8 (GLOVE) ×2
GLOVE ORTHO TXT STRL SZ7.5 (GLOVE) ×3 IMPLANT
GLOVE PI ORTHO PRO STRL SZ8 (GLOVE) ×1 IMPLANT
GLOVE SURG ORTHO 8.0 STRL STRW (GLOVE) ×6 IMPLANT
GOWN STRL REUS W/ TWL LRG LVL3 (GOWN DISPOSABLE) IMPLANT
GOWN STRL REUS W/TWL LRG LVL3 (GOWN DISPOSABLE)
HANDPIECE INTERPULSE COAX TIP (DISPOSABLE) ×2
IMMOBILIZER KNEE 22 UNIV (SOFTGOODS) ×3 IMPLANT
KIT BASIN OR (CUSTOM PROCEDURE TRAY) ×3 IMPLANT
KIT ROOM TURNOVER OR (KITS) ×3 IMPLANT
MANIFOLD NEPTUNE II (INSTRUMENTS) ×3 IMPLANT
NS IRRIG 1000ML POUR BTL (IV SOLUTION) ×3 IMPLANT
PACK ORTHO EXTREMITY (CUSTOM PROCEDURE TRAY) ×3 IMPLANT
PAD ARMBOARD 7.5X6 YLW CONV (MISCELLANEOUS) ×6 IMPLANT
SET HNDPC FAN SPRY TIP SCT (DISPOSABLE) ×1 IMPLANT
SPONGE LAP 18X18 X RAY DECT (DISPOSABLE) ×3 IMPLANT
STOCKINETTE IMPERVIOUS 9X36 MD (GAUZE/BANDAGES/DRESSINGS) ×3 IMPLANT
SUT ETHILON 3 0 PS 1 (SUTURE) IMPLANT
SWAB CULTURE ESWAB REG 1ML (MISCELLANEOUS) ×3 IMPLANT
TOWEL OR 17X24 6PK STRL BLUE (TOWEL DISPOSABLE) ×3 IMPLANT
TOWEL OR 17X26 10 PK STRL BLUE (TOWEL DISPOSABLE) ×3 IMPLANT
TUBE CONNECTING 12'X1/4 (SUCTIONS) ×1
TUBE CONNECTING 12X1/4 (SUCTIONS) ×2 IMPLANT
UNDERPAD 30X30 (UNDERPADS AND DIAPERS) ×3 IMPLANT
WATER STERILE IRR 1000ML POUR (IV SOLUTION) ×3 IMPLANT
YANKAUER SUCT BULB TIP NO VENT (SUCTIONS) ×3 IMPLANT

## 2017-05-18 NOTE — Discharge Instructions (Signed)
Diet: As you were doing prior to hospitalization   Shower:  May shower but keep the wounds dry, use an occlusive plastic wrap, NO SOAKING IN TUB.  If the bandage gets wet, change with a clean dry gauze.  If you have a splint on, leave the splint in place and keep the splint dry with a plastic bag.  Dressing:  Change your dressing daily with wet to dry gauze.  Activity:  Increase activity slowly as tolerated, but follow the weight bearing instructions below.  The rules on driving is that you can not be taking narcotics while you drive, and you must feel in control of the vehicle.    Weight Bearing:   As tolerated  To prevent constipation: you may use a stool softener such as -  Colace (over the counter) 100 mg by mouth twice a day  Drink plenty of fluids (prune juice may be helpful) and high fiber foods Miralax (over the counter) for constipation as needed.    Itching:  If you experience itching with your medications, try taking only a single pain pill, or even half a pain pill at a time.  You may take up to 10 pain pills per day, and you can also use benadryl over the counter for itching or also to help with sleep.   Precautions:  If you experience chest pain or shortness of breath - call 911 immediately for transfer to the hospital emergency department!!  If you develop a fever greater that 101 F, purulent drainage from wound, increased redness or drainage from wound, or calf pain -- Call the office at [phone]                                                Follow- Up Appointment:  Please call for an appointment to be [place] [phone]

## 2017-05-18 NOTE — Anesthesia Procedure Notes (Signed)
Anesthesia Regional Block: Adductor canal block   Pre-Anesthetic Checklist: ,, timeout performed, Correct Patient, Correct Site, Correct Laterality, Correct Procedure, Correct Position, site marked, Risks and benefits discussed,  Surgical consent,  Pre-op evaluation,  At surgeon's request and post-op pain management  Laterality: Left  Prep: chloraprep       Needles:  Injection technique: Single-shot  Needle Type: Stimiplex     Needle Length: 9cm  Needle Gauge: 21     Additional Needles:   Procedures: ultrasound guided,,,,,,,,  Narrative:  Start time: 05/18/2017 4:16 PM End time: 05/18/2017 4:18 PM Injection made incrementally with aspirations every 5 mL.  Performed by: Personally  Anesthesiologist: Lewie LoronGERMEROTH, Caral Whan  Additional Notes: BP cuff, EKG monitors applied. Sedation begun. Artery and nerve location verified with U/S and anesthetic injected incrementally, slowly, and after negative aspirations under direct u/s guidance. Good fascial /perineural spread. Tolerated well.

## 2017-05-18 NOTE — Op Note (Signed)
05/18/2017  5:12 PM  PATIENT:  Kim Harris    PRE-OPERATIVE DIAGNOSIS:  INFECTED LEFT KNEE prepatellar bursa  POST-OPERATIVE DIAGNOSIS:  Same  PROCEDURE:  IRRIGATION AND DEBRIDEMENT INFECTED LEFT KNEE PREPATELLAR BURSA WITH REGIONAL BLOCK WITH PRE-PATELLA BURSECTOMY  SURGEON:  Eulas PostLANDAU,Jaquarius Seder P, MD  PHYSICIAN ASSISTANT: Janace LittenBrandon Parry, OPA-C, present and scrubbed throughout the case, critical for completion in a timely fashion, and for retraction, instrumentation, and closure.  ANESTHESIA:   General  PREOPERATIVE INDICATIONS:  Kim Harris is a  61 y.o. female with a diagnosis of INFECTED LEFT KNEE prepatellar bursa who failed conservative measures and elected for surgical management.  She had a trial of doxycycline, as an outpatient, but failed to improve. She developed significant swelling and erythema over the prepatellar bursa with maintained range of motion of the knee.  The risks benefits and alternatives were discussed with the patient preoperatively including but not limited to the risks of infection, bleeding, nerve injury, cardiopulmonary complications, the need for revision surgery, among others, and the patient was willing to proceed. We also discussed the potential need for revision I and D, as well as the potential need for IV antibiotics.  ESTIMATED BLOOD LOSS: 50 mL  OPERATIVE IMPLANTS: Penrose drain 1  OPERATIVE FINDINGS: Substantial prepatellar septic bursitis with 40 mL of pus, no evidence for tracking in the joint.  OPERATIVE PROCEDURE: The patient is brought to the operating room and placed in the supine position. Gen. anesthesia was administered. She did receive IV Ancef, and then followed up with vancomycin after cultures were taken. The left lower extremity was prepped and draped in usual sterile fashion. Time out performed. Anterior incision over the knee was carried out, and substantial purulence expressed. This was collected and sent in culture tubes as  well as specimen containers for Gram stain culture and sensitivity.  A scalpel, a rongeur, and pickup forceps were used to excise the prepatellar bursa, and complete excision carried out. Close inspection of the fascia and the retinaculum demonstrated no evidence for communication to the joint.  A total of 6 L of fluid was irrigated through the prepatellar bursa, and the wounds were left open and partially closed superiorly and inferiorly, Penrose drain placed, but the wound left open to drain. A knee immobilizer was placed as well as sterile gauze. I did utilize a tourniquet for better visualization because it was fairly hemorrhagic  The patient was awakened and returned to the PACU in stable and satisfactory condition. There were no complications and she tolerated the procedure well. I will plan to place her on Augmentin, and then have close follow-up to monitor her wound, and response to the surgical and medical treatment.

## 2017-05-18 NOTE — Anesthesia Procedure Notes (Signed)
Procedure Name: LMA Insertion Date/Time: 05/18/2017 4:40 PM Performed by: Jefm MilesENNIE, Keywon Mestre E Pre-anesthesia Checklist: Patient identified, Emergency Drugs available, Suction available and Patient being monitored Patient Re-evaluated:Patient Re-evaluated prior to induction Oxygen Delivery Method: Circle System Utilized Preoxygenation: Pre-oxygenation with 100% oxygen Induction Type: IV induction Ventilation: Mask ventilation without difficulty LMA: LMA inserted LMA Size: 4.0 Number of attempts: 1 Airway Equipment and Method: Bite block Placement Confirmation: positive ETCO2 Tube secured with: Tape Dental Injury: Teeth and Oropharynx as per pre-operative assessment

## 2017-05-18 NOTE — Anesthesia Preprocedure Evaluation (Signed)
Anesthesia Evaluation  Patient identified by MRN, date of birth, ID band Patient awake    Reviewed: Allergy & Precautions, NPO status , Patient's Chart, lab work & pertinent test results  Airway Mallampati: II  TM Distance: >3 FB Neck ROM: Full    Dental  (+) Chipped   Pulmonary pneumonia,    Pulmonary exam normal breath sounds clear to auscultation       Cardiovascular negative cardio ROS Normal cardiovascular exam Rhythm:Regular Rate:Normal     Neuro/Psych negative neurological ROS  negative psych ROS   GI/Hepatic negative GI ROS, (+) Hepatitis -  Endo/Other  negative endocrine ROS  Renal/GU negative Renal ROS  negative genitourinary   Musculoskeletal negative musculoskeletal ROS (+)   Abdominal   Peds negative pediatric ROS (+)  Hematology  (+) Blood dyscrasia, anemia ,   Anesthesia Other Findings   Reproductive/Obstetrics negative OB ROS                            Anesthesia Physical Anesthesia Plan  ASA: II  Anesthesia Plan: General   Post-op Pain Management:    Induction: Intravenous  PONV Risk Score and Plan: 3 and Ondansetron, Dexamethasone and Midazolam  Airway Management Planned: LMA  Additional Equipment:   Intra-op Plan:   Post-operative Plan: Extubation in OR  Informed Consent: I have reviewed the patients History and Physical, chart, labs and discussed the procedure including the risks, benefits and alternatives for the proposed anesthesia with the patient or authorized representative who has indicated his/her understanding and acceptance.   Dental advisory given  Plan Discussed with: CRNA  Anesthesia Plan Comments:         Anesthesia Quick Evaluation

## 2017-05-18 NOTE — Transfer of Care (Signed)
Immediate Anesthesia Transfer of Care Note  Patient: Kim Harris  Procedure(s) Performed: Procedure(s): IRRIGATION AND DEBRIDEMENT INFECTED LEFT KNEE WITH PRE-PATELLA BURSECTOMY (Left)  Patient Location: PACU  Anesthesia Type:General and Regional  Level of Consciousness: awake, alert  and oriented  Airway & Oxygen Therapy: Patient Spontanous Breathing  Post-op Assessment: Report given to RN  Post vital signs: Reviewed and stable  Last Vitals:  Vitals:   05/18/17 1617 05/18/17 1623  BP: (!) 155/59 (!) 161/57  Pulse: 88 91  Resp:  17  Temp:    SpO2: 98% 98%    Last Pain:  Vitals:   05/18/17 1134  PainSc: 8       Patients Stated Pain Goal: 3 (05/18/17 1134)  Complications: No apparent anesthesia complications

## 2017-05-18 NOTE — H&P (Signed)
PREOPERATIVE H&P  Chief Complaint: INFECTED LEFT KNEE bursa  HPI: Kim Harris is a 61 y.o. female who presents for preoperative history and physical with a diagnosis of INFECTED LEFT KNEE prepatellar bursa. Symptoms are rated as moderate to severe, and have been worsening.  This is significantly impairing activities of daily living.  She has elected for surgical management. She has had outpatient doxycycline without improvement, along with IV Rocephin. Symptoms have been worsening over the past week or so. Denies deep knee pain.  Past Medical History:  Diagnosis Date  . Anasarca   . Anemia   . Anxiety   . Cirrhosis (HCC)   . Depression   . HCV (hepatitis C virus)   . Hepatitis    c  . Pneumonia    Past Surgical History:  Procedure Laterality Date  . CHOLECYSTECTOMY     Social History   Social History  . Marital status: Single    Spouse name: N/A  . Number of children: N/A  . Years of education: N/A   Social History Main Topics  . Smoking status: Never Smoker  . Smokeless tobacco: Not on file  . Alcohol use No  . Drug use: No  . Sexual activity: Not on file   Other Topics Concern  . Not on file   Social History Narrative  . No narrative on file   No family history on file. Allergies  Allergen Reactions  . Acetaminophen Other (See Comments)    Told to avoid due to history of liver dysfunction  . Diphenhydramine Hcl Other (See Comments)    Palpitations  . Pseudoeph-Doxylamine-Dm-Apap Other (See Comments)    Nyquil - palpitations   Prior to Admission medications   Medication Sig Start Date End Date Taking? Authorizing Provider  citalopram (CELEXA) 20 MG tablet Take 20 mg by mouth daily.   Yes [provider]  furosemide (LASIX) 40 MG tablet Take 40-80 mg by mouth 2 (two) times daily with a meal. Takes 80mg  in the AM and 40mg  in the PM.   Yes [provider]  spironolactone (ALDACTONE) 50 MG tablet Take 50 mg by mouth 2 (two) times daily.    Yes [provider]  thiamine (VITAMIN B-1) 100 MG tablet Take 100 mg by mouth daily.   Yes [provider]  lactulose (CHRONULAC) 10 GM/15ML solution Take 20-60 g by mouth 3 (three) times daily as needed for mild constipation.    [provider]  omeprazole (PRILOSEC) 20 MG capsule Take 20 mg by mouth daily as needed (acid reflux).    [provider]     Positive ROS: All other systems have been reviewed and were otherwise negative with the exception of those mentioned in the HPI and as above.  Physical Exam: General: Alert, no acute distress Cardiovascular: No pedal edema Respiratory: No cyanosis, no use of accessory musculature GI: No organomegaly, abdomen is soft and non-tender Skin: No lesions in the area of chief complaint Neurologic: Sensation intact distally Psychiatric: Patient is competent for consent with normal mood and affect Lymphatic: No axillary or cervical lymphadenopathy  MUSCULOSKELETAL: Left knee has substantial soft tissue swelling anteriorly over the prepatellar bursa, with range of motion from 0-80, positive pain diffusely over the prepatellar bursa.  Assessment: Left knee septic prepatellar bursitis with multiple risk factors as indicated above   Plan: Plan for Procedure(s): IRRIGATION AND DEBRIDEMENT INFECTED LEFT KNEE WITH PRE-PATELLA BURSECTOMY  The risks benefits and alternatives were discussed with the patient including but  not limited to the risks of nonoperative treatment, versus surgical intervention including infection, bleeding, nerve injury,  blood clots, cardiopulmonary complications, morbidity, mortality, among others, and they were willing to proceed. Also discussed the risks for progression of osteomyelitis, deep knee joint infection, the potential need for extended antibiotics, even extended IV antibiotics, among others and she is willing to proceed.  Eulas PostLANDAU,Hilde Churchman P, MD Cell [phone]   05/18/2017 3:45 PM

## 2017-05-19 ENCOUNTER — Encounter (HOSPITAL_COMMUNITY): Payer: Self-pay | Admitting: Orthopedic Surgery

## 2017-05-19 NOTE — Discharge Summary (Signed)
Physician Discharge Summary  Patient ID: Kim Harris MRN: [id number] DOB/AGE: 1956/07/01 61 y.o.  Admit date: 05/18/2017 Discharge date: 05/18/2017  Admission Diagnoses:  Septic prepatellar bursitis of left knee  Discharge Diagnoses:  Principal Problem:   Septic prepatellar bursitis of left knee   Past Medical History:  Diagnosis Date  . Anasarca   . Anemia   . Anxiety   . Cirrhosis (HCC)   . Depression   . HCV (hepatitis C virus)   . Hepatitis    c  . Pneumonia   . Septic prepatellar bursitis of left knee 05/18/2017    Surgeries: Procedure(s): IRRIGATION AND DEBRIDEMENT INFECTED LEFT KNEE WITH PRE-PATELLA BURSECTOMY on 05/18/2017   Consultants (if any):   Discharged Condition: Improved  Hospital Course: Kim BlaseSetina G Zavadil is an 61 y.o. female who was admitted 05/18/2017 with a diagnosis of Septic prepatellar bursitis of left knee and went to the operating room on 05/18/2017 and underwent the above named procedures.    She was given perioperative antibiotics:  Anti-infectives    Start     Dose/Rate Route Frequency Ordered Stop   05/18/17 1615  ceFAZolin (ANCEF) IVPB 2g/100 mL premix     2 g 200 mL/hr over 30 Minutes Intravenous On call to O.R. 05/18/17 1604 05/18/17 1645   05/18/17 0000  amoxicillin-clavulanate (AUGMENTIN) 875-125 MG tablet     1 tablet Oral 2 times daily 05/18/17 1631 06/01/17 2359    .  She was given sequential compression devices, early ambulation,  for DVT prophylaxis.  She benefited maximally from the hospital stay and there were no complications.    Recent vital signs:  Vitals:   05/18/17 1848 05/18/17 1900  BP: 125/60   Pulse: 76   Resp: 16   Temp:  (!) 97.2 F (36.2 C)  SpO2: 97%     Recent laboratory studies:  Lab Results  Component Value Date   HGB 11.2 (L) 05/18/2017   HGB 9.9 (L) 04/01/2010   HGB 8.0 (L) 03/19/2010   Lab Results  Component Value Date   WBC 6.8 05/18/2017   PLT 71 (L) 05/18/2017   Lab Results  Component  Value Date   INR 1.74 (H) 02/10/2010   Lab Results  Component Value Date   NA 139 05/18/2017   K 4.0 05/18/2017   CL 110 05/18/2017   CO2 23 05/18/2017   BUN 15 05/18/2017   CREATININE 1.06 (H) 05/18/2017   GLUCOSE 92 05/18/2017    Discharge Medications:   Allergies as of 05/18/2017      Reactions   Acetaminophen Other (See Comments)   Told to avoid due to history of liver dysfunction   Diphenhydramine Hcl Other (See Comments)   Palpitations   Pseudoeph-doxylamine-dm-apap Other (See Comments)   Nyquil - palpitations      Medication List    TAKE these medications   amoxicillin-clavulanate 875-125 MG tablet Commonly known as:  AUGMENTIN Take 1 tablet by mouth 2 (two) times daily.   citalopram 20 MG tablet Commonly known as:  CELEXA Take 20 mg by mouth daily.   furosemide 40 MG tablet Commonly known as:  LASIX Take 40-80 mg by mouth 2 (two) times daily with a meal. Takes 80mg  in the AM and 40mg  in the PM.   lactulose 10 GM/15ML solution Commonly known as:  CHRONULAC Take 20-60 g by mouth 3 (three) times daily as needed for mild constipation.   omeprazole 20 MG capsule Commonly known as:  PRILOSEC Take 20 mg by  mouth daily as needed (acid reflux).   oxyCODONE 5 MG immediate release tablet Commonly known as:  ROXICODONE Take 1-2 tablets (5-10 mg total) by mouth every 4 (four) hours as needed for severe pain.   sennosides-docusate sodium 8.6-50 MG tablet Commonly known as:  SENOKOT-S Take 2 tablets by mouth daily.   spironolactone 50 MG tablet Commonly known as:  ALDACTONE Take 50 mg by mouth 2 (two) times daily.   thiamine 100 MG tablet Commonly known as:  VITAMIN B-1 Take 100 mg by mouth daily.       Diagnostic Studies: No results found.  Disposition: 01-Home or Self Care    Follow-up Information    Teryl Lucy, MD. Schedule an appointment as soon as possible for a visit on 05/22/2017.   Specialty:  Orthopedic Surgery Contact information: [address] [place] [address] [phone]            Signed: Eulas Post 05/19/2017, 10:35 AM

## 2017-05-19 NOTE — Anesthesia Postprocedure Evaluation (Signed)
Anesthesia Post Note  Patient: Kim Harris  Procedure(s) Performed: Procedure(s) (LRB): IRRIGATION AND DEBRIDEMENT INFECTED LEFT KNEE WITH PRE-PATELLA BURSECTOMY (Left)     Patient location during evaluation: PACU Anesthesia Type: General and Regional Level of consciousness: sedated and patient cooperative Pain management: pain level controlled Vital Signs Assessment: post-procedure vital signs reviewed and stable Respiratory status: spontaneous breathing Cardiovascular status: stable Anesthetic complications: no    Last Vitals:  Vitals:   05/18/17 1848 05/18/17 1900  BP: 125/60   Pulse: 76   Resp: 16   Temp:  (!) 36.2 C  SpO2: 97%     Last Pain:  Vitals:   05/18/17 1900  PainSc: 0-No pain                 Lewie LoronJohn Chantelle Verdi

## 2017-05-23 LAB — AEROBIC/ANAEROBIC CULTURE W GRAM STAIN (SURGICAL/DEEP WOUND)

## 2017-05-23 LAB — AEROBIC/ANAEROBIC CULTURE (SURGICAL/DEEP WOUND)

## 2022-06-15 ENCOUNTER — Other Ambulatory Visit: Payer: Self-pay

## 2022-06-15 ENCOUNTER — Ambulatory Visit: Payer: Medicare Other | Attending: Gastroenterology

## 2022-06-15 DIAGNOSIS — M6281 Muscle weakness (generalized): Secondary | ICD-10-CM | POA: Insufficient documentation

## 2022-06-15 DIAGNOSIS — R2689 Other abnormalities of gait and mobility: Secondary | ICD-10-CM | POA: Diagnosis present

## 2022-06-15 NOTE — Therapy (Signed)
OUTPATIENT PHYSICAL THERAPY LOWER EXTREMITY EVALUATION   Patient Name: Kim Harris MRN: [id number] DOB:Aug 09, 1956, 66 y.o., female Today's Date: 06/15/2022   PT End of Session - 06/15/22 1354     Visit Number 1    Number of Visits 12    Date for PT Re-Evaluation 09/02/22    PT Start Time 1355    PT Stop Time 1435    PT Time Calculation (min) 40 min    Activity Tolerance Patient tolerated treatment well    Behavior During Therapy [facility] for tasks assessed/performed             Past Medical History:  Diagnosis Date   Anasarca    Anemia    Anxiety    Cirrhosis (HCC)    Depression    HCV (hepatitis C virus)    Hepatitis    c   Pneumonia    Septic prepatellar bursitis of left knee 05/18/2017   Past Surgical History:  Procedure Laterality Date   CHOLECYSTECTOMY     I & D EXTREMITY Left 05/18/2017   Procedure: IRRIGATION AND DEBRIDEMENT INFECTED LEFT KNEE WITH PRE-PATELLA BURSECTOMY;  Surgeon: Teryl Lucy, MD;  Location: [facility] OR;  Service: Orthopedics;  Laterality: Left;   Patient Active Problem List   Diagnosis Date Noted   Septic prepatellar bursitis of left knee 05/18/2017   HEPATITIS C 02/07/2010   CIRRHOSIS 02/07/2010   ALCOHOLIC CIRRHOSIS OF LIVER 02/04/2010   UNSPECIFIED DISORDER OF LIVER 02/02/2010   REFERRING PROVIDER: Rosine Beat, MD  REFERRING DIAG: Frailty  THERAPY DIAG:  Muscle weakness (generalized)  Other abnormalities of gait and mobility  Rationale for Evaluation and Treatment Rehabilitation  ONSET DATE: about a year ago  SUBJECTIVE:   SUBJECTIVE STATEMENT: Patient reports that she has been getting weaker over the past year. She notes that she has been walking slower and has not been able to walk as far.   PERTINENT HISTORY: Bipolar and HTN  PAIN:  Are you having pain? No  PRECAUTIONS: None  WEIGHT BEARING RESTRICTIONS No  FALLS:  Has patient fallen in last 6 months? No  LIVING ENVIRONMENT: Lives with: lives with an  adult companion Lives in: House/apartment Stairs: Yes: Internal: 13 steps; can reach both and External: 1 steps; can reach both Has following equipment at home: None  OCCUPATION: none   PLOF: Independent  PATIENT GOALS walking (only able to walk about 10 minutes currently), get stronger   OBJECTIVE:   COGNITION:  Overall cognitive status: Within functional limits for tasks assessed     SENSATION: WFL  POSTURE: rounded shoulders and forward head  UPPER AND LOWER EXTREMITY ROM: WFL for activities assessed  UPPER EXTREMITY MMT:  MMT Right eval Left eval  Shoulder flexion 4-/5 4-/5  Shoulder extension    Shoulder abduction    Shoulder adduction    Shoulder extension    Shoulder internal rotation    Shoulder external rotation    Middle trapezius    Lower trapezius    Elbow flexion 4-/5 4-/5  Elbow extension 4-/5 4-/5  Wrist flexion    Wrist extension    Wrist ulnar deviation    Wrist radial deviation    Wrist pronation    Wrist supination    Grip strength 20 8   (Blank rows = not tested)   LOWER EXTREMITY MMT:  MMT Right eval Left eval  Hip flexion 4-/5 4-/5  Hip extension    Hip abduction    Hip adduction  Hip internal rotation    Hip external rotation    Knee flexion 4/5 4/5  Knee extension 4+/5 4+/5  Ankle dorsiflexion 4/5 4/5  Ankle plantarflexion    Ankle inversion    Ankle eversion     (Blank rows = not tested)  FUNCTIONAL TESTS:  5 times sit to stand: 24.06 seconds w/ intermittent UE support despite cueing Timed up and go (TUG): 15.48 seconds without AD  3 minute walk test: 330 feet without AD   GAIT: Assistive device utilized: None Level of assistance: Complete Independence Comments: poor bilateral foot clearance, decreased stride length, gait speed, and absent heel strike and toe off   TODAY'S TREATMENT:    PATIENT EDUCATION:  Education details: walking, prognosis, POC Person educated: Patient Education method:  Explanation Education comprehension: verbalized understanding   HOME EXERCISE PROGRAM:   ASSESSMENT:  CLINICAL IMPRESSION: Patient is a 66 y.o. female who was seen today for physical therapy evaluation and treatment for generalized weakness. She is at an elevated fall risk as evidenced by her five time sit to stand and timed up and go times. She also exhibited reduced upper and lower extremity strength as evidenced by her manual muscle test and grip strength scores. Recommend that she continue with skilled physical therapy to address her impairments to maximize her functional mobility and safety.    OBJECTIVE IMPAIRMENTS Abnormal gait, decreased activity tolerance, decreased endurance, decreased mobility, difficulty walking, decreased strength, impaired UE functional use, and postural dysfunction.   ACTIVITY LIMITATIONS carrying, lifting, stairs, transfers, and locomotion level  PARTICIPATION LIMITATIONS: community activity  PERSONAL FACTORS Time since onset of injury/illness/exacerbation and 1-2 comorbidities: Bipolar and HTN  are also affecting patient's functional outcome.   REHAB POTENTIAL: Fair    CLINICAL DECISION MAKING: Stable/uncomplicated  EVALUATION COMPLEXITY: Low   GOALS: Goals reviewed with patient? Yes  SHORT TERM GOALS: Target date: 07/06/2022  Patient will be independent with her initial HEP.  Baseline: Goal status: INITIAL  2.  Patient will be able to improve her five time sit to stand to 19 seconds or less. Baseline:  Goal status: INITIAL  LONG TERM GOALS: Target date: 07/27/2022   Patient will be independent with her advanced HEP.  Baseline:  Goal status: INITIAL  2.  Patient will improve her TUG time to 12 seconds or less for improved safety.  Baseline:  Goal status: INITIAL  3.  Patient will improve her five time sit to stand to 12 seconds or less for improved lower extremity power.  Baseline:  Goal status: INITIAL  4.  Patient will be able  to walk at least 20 minutes prior to being limited by fatigue.  Baseline:  Goal status: INITIAL  5.  Patient will be able to walk at least 360 feet in three minutes for improved functional mobility.  Baseline:  Goal status: INITIAL  6.  Patient will be able to demonstrate at least 15 pounds of grip strength in her left hand.  Baseline:  Goal status: INITIAL   PLAN: PT FREQUENCY: 2x/week  PT DURATION: 6 weeks  PLANNED INTERVENTIONS: Therapeutic exercises, Therapeutic activity, Neuromuscular re-education, Balance training, Gait training, Patient/Family education, Self Care, Joint mobilization, Stair training, and Re-evaluation  PLAN FOR NEXT SESSION: nustep, UBE, global strengthening, and transfers   Granville Lewis, PT 06/15/2022, 3:10 PM

## 2022-06-20 ENCOUNTER — Ambulatory Visit: Payer: Medicare Other

## 2022-06-20 DIAGNOSIS — R2689 Other abnormalities of gait and mobility: Secondary | ICD-10-CM

## 2022-06-20 DIAGNOSIS — M6281 Muscle weakness (generalized): Secondary | ICD-10-CM

## 2022-06-20 NOTE — Therapy (Signed)
OUTPATIENT PHYSICAL THERAPY LOWER EXTREMITY TREATMENT   Patient Name: Kim Harris MRN: [id number] DOB:Feb 24, 1956, 66 y.o., female Today's Date: 06/20/2022   PT End of Session - 06/20/22 1443     Visit Number 2    Number of Visits 12    Date for PT Re-Evaluation 09/02/22    PT Start Time 1430    PT Stop Time 1518    PT Time Calculation (min) 48 min    Activity Tolerance Patient tolerated treatment well    Behavior During Therapy [facility] for tasks assessed/performed              Past Medical History:  Diagnosis Date   Anasarca    Anemia    Anxiety    Cirrhosis (HCC)    Depression    HCV (hepatitis C virus)    Hepatitis    c   Pneumonia    Septic prepatellar bursitis of left knee 05/18/2017   Past Surgical History:  Procedure Laterality Date   CHOLECYSTECTOMY     I & D EXTREMITY Left 05/18/2017   Procedure: IRRIGATION AND DEBRIDEMENT INFECTED LEFT KNEE WITH PRE-PATELLA BURSECTOMY;  Surgeon: Teryl Lucy, MD;  Location: [facility] OR;  Service: Orthopedics;  Laterality: Left;   Patient Active Problem List   Diagnosis Date Noted   Septic prepatellar bursitis of left knee 05/18/2017   HEPATITIS C 02/07/2010   CIRRHOSIS 02/07/2010   ALCOHOLIC CIRRHOSIS OF LIVER 02/04/2010   UNSPECIFIED DISORDER OF LIVER 02/02/2010   REFERRING PROVIDER: Rosine Beat, MD  REFERRING DIAG: Frailty  THERAPY DIAG:  Muscle weakness (generalized)  Other abnormalities of gait and mobility  Rationale for Evaluation and Treatment Rehabilitation  ONSET DATE: about a year ago  SUBJECTIVE:   SUBJECTIVE STATEMENT: Patient reports that she feels alright today.   PERTINENT HISTORY: Bipolar and HTN  PAIN:  Are you having pain? No  PRECAUTIONS: None  WEIGHT BEARING RESTRICTIONS No  FALLS:  Has patient fallen in last 6 months? No  LIVING ENVIRONMENT: Lives with: lives with an adult companion Lives in: House/apartment Stairs: Yes: Internal: 13 steps; can reach both and External:  1 steps; can reach both Has following equipment at home: None  OCCUPATION: none   PLOF: Independent  PATIENT GOALS walking (only able to walk about 10 minutes currently), get stronger   OBJECTIVE: all objective measures were assessed on 06/15/22 at her initial evaluation unless otherwise noted  COGNITION:  Overall cognitive status: Within functional limits for tasks assessed     SENSATION: WFL  POSTURE: rounded shoulders and forward head  UPPER AND LOWER EXTREMITY ROM: WFL for activities assessed  UPPER EXTREMITY MMT:  MMT Right eval Left eval  Shoulder flexion 4-/5 4-/5  Shoulder extension    Shoulder abduction    Shoulder adduction    Shoulder extension    Shoulder internal rotation    Shoulder external rotation    Middle trapezius    Lower trapezius    Elbow flexion 4-/5 4-/5  Elbow extension 4-/5 4-/5  Wrist flexion    Wrist extension    Wrist ulnar deviation    Wrist radial deviation    Wrist pronation    Wrist supination    Grip strength 20 8   (Blank rows = not tested)   LOWER EXTREMITY MMT:  MMT Right eval Left eval  Hip flexion 4-/5 4-/5  Hip extension    Hip abduction    Hip adduction    Hip internal rotation    Hip external  rotation    Knee flexion 4/5 4/5  Knee extension 4+/5 4+/5  Ankle dorsiflexion 4/5 4/5  Ankle plantarflexion    Ankle inversion    Ankle eversion     (Blank rows = not tested)  FUNCTIONAL TESTS:  5 times sit to stand: 24.06 seconds w/ intermittent UE support despite cueing Timed up and go (TUG): 15.48 seconds without AD  3 minute walk test: 330 feet without AD   GAIT: Assistive device utilized: None Level of assistance: Complete Independence Comments: poor bilateral foot clearance, decreased stride length, gait speed, and absent heel strike and toe off   TODAY'S TREATMENT:                                   9/11 EXERCISE LOG  Exercise Repetitions and Resistance Comments  Nustep L3 x 15 minutes   Rocker  board 4 minutes   Standing hip ABD 2 minutes   Therabar bending (up and down) Red t-bar x 30 reps each    Therabar twisting 2 minutes   Hip ADD isometric 20 reps w/ 5 second hold   LAQ 2 minutes   Seated row Green t-band x 2 minutes   Seated clams 30 reps    Blank cell = exercise not performed today    PATIENT EDUCATION:  Education details: HEP, fatigue Person educated: Patient Education method: Explanation Education comprehension: verbalized understanding   HOME EXERCISE PROGRAM: [id number]  ASSESSMENT:  CLINICAL IMPRESSION: Patient was introduced to multiple new upper and lower extremity interventions for improved strength needed for improved function with her daily activities. She required minimal to moderate verbal and visual cueing with today's new interventions for proper positioning to facilitate improved muscular strengthening. Fatigue was her primary limitation with today's interventions. She reported feeling "a little tired" upon the conclusion of treatment. She continues to require skilled physical therapy to address her remaining impairments to maximize her functional strength and mobility.    OBJECTIVE IMPAIRMENTS Abnormal gait, decreased activity tolerance, decreased endurance, decreased mobility, difficulty walking, decreased strength, impaired UE functional use, and postural dysfunction.   ACTIVITY LIMITATIONS carrying, lifting, stairs, transfers, and locomotion level  PARTICIPATION LIMITATIONS: community activity  PERSONAL FACTORS Time since onset of injury/illness/exacerbation and 1-2 comorbidities: Bipolar and HTN  are also affecting patient's functional outcome.   REHAB POTENTIAL: Fair    CLINICAL DECISION MAKING: Stable/uncomplicated  EVALUATION COMPLEXITY: Low   GOALS: Goals reviewed with patient? Yes  SHORT TERM GOALS: Target date: 07/06/2022  Patient will be independent with her initial HEP.  Baseline: Goal status: INITIAL  2.  Patient will be  able to improve her five time sit to stand to 19 seconds or less. Baseline:  Goal status: INITIAL  LONG TERM GOALS: Target date: 07/27/2022   Patient will be independent with her advanced HEP.  Baseline:  Goal status: INITIAL  2.  Patient will improve her TUG time to 12 seconds or less for improved safety.  Baseline:  Goal status: INITIAL  3.  Patient will improve her five time sit to stand to 12 seconds or less for improved lower extremity power.  Baseline:  Goal status: INITIAL  4.  Patient will be able to walk at least 20 minutes prior to being limited by fatigue.  Baseline:  Goal status: INITIAL  5.  Patient will be able to walk at least 360 feet in three minutes for improved functional mobility.  Baseline:  Goal  status: INITIAL  6.  Patient will be able to demonstrate at least 15 pounds of grip strength in her left hand.  Baseline:  Goal status: INITIAL   PLAN: PT FREQUENCY: 2x/week  PT DURATION: 6 weeks  PLANNED INTERVENTIONS: Therapeutic exercises, Therapeutic activity, Neuromuscular re-education, Balance training, Gait training, Patient/Family education, Self Care, Joint mobilization, Stair training, and Re-evaluation  PLAN FOR NEXT SESSION: nustep, UBE, global strengthening, and transfers   Granville Lewis, PT 06/20/2022, 3:36 PM

## 2022-06-22 ENCOUNTER — Ambulatory Visit: Payer: Medicare Other | Admitting: Physical Therapy

## 2022-06-22 ENCOUNTER — Encounter: Payer: Self-pay | Admitting: Physical Therapy

## 2022-06-22 DIAGNOSIS — M6281 Muscle weakness (generalized): Secondary | ICD-10-CM

## 2022-06-22 DIAGNOSIS — R2689 Other abnormalities of gait and mobility: Secondary | ICD-10-CM

## 2022-06-22 NOTE — Therapy (Signed)
OUTPATIENT PHYSICAL THERAPY LOWER EXTREMITY TREATMENT   Patient Name: Kim Harris MRN: [id number] DOB:05-Jul-1956, 66 y.o., female Today's Date: 06/22/2022   PT End of Session - 06/22/22 1406     Visit Number 3    Number of Visits 12    Date for PT Re-Evaluation 09/02/22    PT Start Time 1350    PT Stop Time 1430    PT Time Calculation (min) 40 min    Activity Tolerance Patient tolerated treatment well    Behavior During Therapy WFL for tasks assessed/performed               Past Medical History:  Diagnosis Date   Anasarca    Anemia    Anxiety    Cirrhosis ([place])    Depression    HCV (hepatitis C virus)    Hepatitis    c   Pneumonia    Septic prepatellar bursitis of left knee 05/18/2017   Past Surgical History:  Procedure Laterality Date   CHOLECYSTECTOMY     I & D EXTREMITY Left 05/18/2017   Procedure: IRRIGATION AND DEBRIDEMENT INFECTED LEFT KNEE WITH PRE-PATELLA BURSECTOMY;  Surgeon: Marchia Bond, MD;  Location: [place];  Service: Orthopedics;  Laterality: Left;   Patient Active Problem List   Diagnosis Date Noted   Septic prepatellar bursitis of left knee 05/18/2017   HEPATITIS C 02/07/2010   CIRRHOSIS 97/74/1423   ALCOHOLIC CIRRHOSIS OF LIVER 02/04/2010   UNSPECIFIED DISORDER OF LIVER 02/02/2010   REFERRING PROVIDER: Quay Burow, MD  REFERRING DIAG: Frailty  THERAPY DIAG:  Muscle weakness (generalized)  Other abnormalities of gait and mobility  Rationale for Evaluation and Treatment Rehabilitation  ONSET DATE: about a year ago  SUBJECTIVE:   SUBJECTIVE STATEMENT: Patient reports that she feels alright today.   PERTINENT HISTORY: Bipolar and HTN  PAIN:  Are you having pain? No  PRECAUTIONS: None  WEIGHT BEARING RESTRICTIONS No  FALLS:  Has patient fallen in last 6 months? No  LIVING ENVIRONMENT: Lives with: lives with an adult companion Lives in: House/apartment Stairs: Yes: Internal: 13 steps; can reach both and  External: 1 steps; can reach both Has following equipment at home: None  OCCUPATION: none   PLOF: Independent  PATIENT GOALS walking (only able to walk about 10 minutes currently), get stronger   OBJECTIVE:   UPPER EXTREMITY MMT:  MMT Right 9/6 Left 9/6  Shoulder flexion 4-/5 4-/5  Shoulder extension    Shoulder abduction    Shoulder adduction    Shoulder extension    Shoulder internal rotation    Shoulder external rotation    Middle trapezius    Lower trapezius    Elbow flexion 4-/5 4-/5  Elbow extension 4-/5 4-/5  Wrist flexion    Wrist extension    Wrist ulnar deviation    Wrist radial deviation    Wrist pronation    Wrist supination    Grip strength 20 8   (Blank rows = not tested)   LOWER EXTREMITY MMT:  MMT Right 9/6 Left 9/6  Hip flexion 4-/5 4-/5  Hip extension    Hip abduction    Hip adduction    Hip internal rotation    Hip external rotation    Knee flexion 4/5 4/5  Knee extension 4+/5 4+/5  Ankle dorsiflexion 4/5 4/5  Ankle plantarflexion    Ankle inversion    Ankle eversion     (Blank rows = not tested)  TODAY'S TREATMENT:  9/13 EXERCISE LOG  Exercise Repetitions and Resistance Comments  Nustep L1 x 15 minutes   Heel/toe raises X20 reps   Standing hip ABD X15 reps Greater weakness noted in LLE  Standing hip flexion X15 reps   Therabar bending (up and down) Red t-bar x 2 min   Therabar twisting 2 minutes   Hip ADD isometric 20 reps w/ 5 second hold   LAQ 2# x20 reps   HS curls  Yellow x20 reps   Seated row Red x20 reps   Seated shoulder ER Red x20 reps   Seated clams 30 reps   Sit <> stands X10 reps; goal assess x10 sec    Blank cell = exercise not performed today    PATIENT EDUCATION:  Education details: HEP, fatigue Person educated: Patient Education method: Explanation Education comprehension: verbalized understanding   HOME EXERCISE PROGRAM: [id number]  ASSESSMENT:  CLINICAL  IMPRESSION: Patient presented in clinic with reports of overall improvement regarding strength. Patient reports that she went walking around her home yesterday for approximately 20 minutes yesterday. Patient reports compliance with HEP as well. Greater LLE weakness noted with standing and sitting exercises. Patient also indicating that grip strength also improving with ADLs. Shoulder weakness also noted especially into flexion.   OBJECTIVE IMPAIRMENTS Abnormal gait, decreased activity tolerance, decreased endurance, decreased mobility, difficulty walking, decreased strength, impaired UE functional use, and postural dysfunction.   ACTIVITY LIMITATIONS carrying, lifting, stairs, transfers, and locomotion level  PARTICIPATION LIMITATIONS: community activity  PERSONAL FACTORS Time since onset of injury/illness/exacerbation and 1-2 comorbidities: Bipolar and HTN  are also affecting patient's functional outcome.   REHAB POTENTIAL: Fair    CLINICAL DECISION MAKING: Stable/uncomplicated  EVALUATION COMPLEXITY: Low   GOALS: Goals reviewed with patient? Yes  SHORT TERM GOALS: Target date: 07/06/2022  Patient will be independent with her initial HEP.  Baseline: Goal status: MET  2.  Patient will be able to improve her five time sit to stand to 19 seconds or less. Baseline:  Goal status: MET  LONG TERM GOALS: Target date: 07/27/2022   Patient will be independent with her advanced HEP.  Baseline:  Goal status: INITIAL  2.  Patient will improve her TUG time to 12 seconds or less for improved safety.  Baseline:  Goal status: INITIAL  3.  Patient will improve her five time sit to stand to 12 seconds or less for improved lower extremity power.  Baseline:  Goal status: MET  4.  Patient will be able to walk at least 20 minutes prior to being limited by fatigue.  Baseline:  Goal status: MET  5.  Patient will be able to walk at least 360 feet in three minutes for improved functional  mobility.  Baseline:  Goal status: INITIAL  6.  Patient will be able to demonstrate at least 15 pounds of grip strength in her left hand.  Baseline:  Goal status: INITIAL   PLAN: PT FREQUENCY: 2x/week  PT DURATION: 6 weeks  PLANNED INTERVENTIONS: Therapeutic exercises, Therapeutic activity, Neuromuscular re-education, Balance training, Gait training, Patient/Family education, Self Care, Joint mobilization, Stair training, and Re-evaluation  PLAN FOR NEXT SESSION: nustep, UBE, global strengthening, and transfers   Standley Brooking, PTA 06/22/2022, 2:43 PM

## 2022-06-27 ENCOUNTER — Encounter: Payer: Self-pay | Admitting: Physical Therapy

## 2022-06-27 ENCOUNTER — Ambulatory Visit: Payer: Medicare Other | Admitting: Physical Therapy

## 2022-06-27 DIAGNOSIS — M6281 Muscle weakness (generalized): Secondary | ICD-10-CM | POA: Diagnosis not present

## 2022-06-27 DIAGNOSIS — R2689 Other abnormalities of gait and mobility: Secondary | ICD-10-CM

## 2022-06-27 NOTE — Therapy (Signed)
OUTPATIENT PHYSICAL THERAPY LOWER EXTREMITY TREATMENT   Patient Name: SIEANNA VANSTONE MRN: [id number] DOB:[birth year], 66 y.o., female Today's Date: 06/27/2022   PT End of Session - 06/27/22 1348     Visit Number 4    Number of Visits 12    Date for PT Re-Evaluation 09/02/22    PT Start Time 1347    PT Stop Time 7517    PT Time Calculation (min) 42 min    Activity Tolerance Patient tolerated treatment well    Behavior During Therapy [facility] for tasks assessed/performed               Past Medical History:  Diagnosis Date   Anasarca    Anemia    Anxiety    Cirrhosis ([place])    Depression    HCV (hepatitis C virus)    Hepatitis    c   Pneumonia    Septic prepatellar bursitis of left knee 05/18/2017   Past Surgical History:  Procedure Laterality Date   CHOLECYSTECTOMY     I & D EXTREMITY Left 05/18/2017   Procedure: IRRIGATION AND DEBRIDEMENT INFECTED LEFT KNEE WITH PRE-PATELLA BURSECTOMY;  Surgeon: Marchia Bond, MD;  Location: [place];  Service: Orthopedics;  Laterality: Left;   Patient Active Problem List   Diagnosis Date Noted   Septic prepatellar bursitis of left knee 05/18/2017   HEPATITIS C 02/07/2010   CIRRHOSIS 00/17/4944   ALCOHOLIC CIRRHOSIS OF LIVER 02/04/2010   UNSPECIFIED DISORDER OF LIVER 02/02/2010   REFERRING PROVIDER: Quay Burow, MD  REFERRING DIAG: Frailty  THERAPY DIAG:  Muscle weakness (generalized)  Other abnormalities of gait and mobility  Rationale for Evaluation and Treatment Rehabilitation  ONSET DATE: about a year ago  SUBJECTIVE:   SUBJECTIVE STATEMENT: Patient reports that she feels alright today.   PERTINENT HISTORY: Bipolar and HTN  PAIN:  Are you having pain? No  PRECAUTIONS: None  WEIGHT BEARING RESTRICTIONS No  OBJECTIVE:   UPPER EXTREMITY MMT:  MMT Right 9/6 Left 9/6  Shoulder flexion 4-/5 4-/5  Shoulder extension    Shoulder abduction    Shoulder adduction    Shoulder extension    Shoulder  internal rotation    Shoulder external rotation    Middle trapezius    Lower trapezius    Elbow flexion 4-/5 4-/5  Elbow extension 4-/5 4-/5  Wrist flexion    Wrist extension    Wrist ulnar deviation    Wrist radial deviation    Wrist pronation    Wrist supination    Grip strength 20 8   (Blank rows = not tested)   LOWER EXTREMITY MMT:  MMT Right 9/6 Left 9/6  Hip flexion 4-/5 4-/5  Hip extension    Hip abduction    Hip adduction    Hip internal rotation    Hip external rotation    Knee flexion 4/5 4/5  Knee extension 4+/5 4+/5  Ankle dorsiflexion 4/5 4/5  Ankle plantarflexion    Ankle inversion    Ankle eversion     (Blank rows = not tested)  TODAY'S TREATMENT:                                   9/18 EXERCISE LOG  Exercise Repetitions and Resistance Comments  Nustep L3 x 15 minutes   Heel/toe raises X20 reps   Standing hip ABD X15 reps Greater weakness noted in LLE  Standing hip flexion  X15 reps   Forward step up 4" x15 reps each   Lateral step up 4" x15 reps each   LAQ 3# x20 reps   HS curls  Red x20 reps   Seated clams 30 reps   Sit <> stands X10 reps; goal assess x10 sec    Blank cell = exercise not performed today    PATIENT EDUCATION:  Education details: HEP, fatigue Person educated: Patient Education method: Explanation Education comprehension: verbalized understanding   HOME EXERCISE PROGRAM: [id number]  ASSESSMENT:  CLINICAL IMPRESSION: Patient presented in clinic with no new complaints. Patient states that she has been working on sit <> stands at home. Patient progressed to more standing, functional activities such as step ups with UE support. Overall strength reportedly improving per patient report.  OBJECTIVE IMPAIRMENTS Abnormal gait, decreased activity tolerance, decreased endurance, decreased mobility, difficulty walking, decreased strength, impaired UE functional use, and postural dysfunction.   ACTIVITY LIMITATIONS carrying, lifting,  stairs, transfers, and locomotion level  PARTICIPATION LIMITATIONS: community activity  PERSONAL FACTORS Time since onset of injury/illness/exacerbation and 1-2 comorbidities: Bipolar and HTN  are also affecting patient's functional outcome.   REHAB POTENTIAL: Fair    CLINICAL DECISION MAKING: Stable/uncomplicated  EVALUATION COMPLEXITY: Low   GOALS: Goals reviewed with patient? Yes  SHORT TERM GOALS: Target date: 07/06/2022  Patient will be independent with her initial HEP.  Baseline: Goal status: MET  2.  Patient will be able to improve her five time sit to stand to 19 seconds or less. Baseline:  Goal status: MET  LONG TERM GOALS: Target date: 07/27/2022   Patient will be independent with her advanced HEP.  Baseline:  Goal status: INITIAL  2.  Patient will improve her TUG time to 12 seconds or less for improved safety.  Baseline:  Goal status: INITIAL  3.  Patient will improve her five time sit to stand to 12 seconds or less for improved lower extremity power.  Baseline:  Goal status: MET  4.  Patient will be able to walk at least 20 minutes prior to being limited by fatigue.  Baseline:  Goal status: MET  5.  Patient will be able to walk at least 360 feet in three minutes for improved functional mobility.  Baseline:  Goal status: INITIAL  6.  Patient will be able to demonstrate at least 15 pounds of grip strength in her left hand.  Baseline:  Goal status: INITIAL   PLAN: PT FREQUENCY: 2x/week  PT DURATION: 6 weeks  PLANNED INTERVENTIONS: Therapeutic exercises, Therapeutic activity, Neuromuscular re-education, Balance training, Gait training, Patient/Family education, Self Care, Joint mobilization, Stair training, and Re-evaluation  PLAN FOR NEXT SESSION: nustep, UBE, global strengthening, and transfers   Standley Brooking, PTA 06/27/2022, 3:01 PM

## 2022-06-29 ENCOUNTER — Ambulatory Visit: Payer: Medicare Other | Admitting: Physical Therapy

## 2022-06-29 ENCOUNTER — Encounter: Payer: Self-pay | Admitting: Physical Therapy

## 2022-06-29 DIAGNOSIS — M6281 Muscle weakness (generalized): Secondary | ICD-10-CM

## 2022-06-29 DIAGNOSIS — R2689 Other abnormalities of gait and mobility: Secondary | ICD-10-CM

## 2022-06-29 NOTE — Therapy (Signed)
OUTPATIENT PHYSICAL THERAPY LOWER EXTREMITY TREATMENT   Patient Name: Kim Harris MRN: [id number] DOB:04/21/1956, 66 y.o., female Today's Date: 06/29/2022   PT End of Session - 06/29/22 1352     Visit Number 5    Number of Visits 12    Date for PT Re-Evaluation 09/02/22    PT Start Time 5916    PT Stop Time 1430    PT Time Calculation (min) 42 min    Activity Tolerance Patient tolerated treatment well    Behavior During Therapy WFL for tasks assessed/performed               Past Medical History:  Diagnosis Date   Anasarca    Anemia    Anxiety    Cirrhosis ([place])    Depression    HCV (hepatitis C virus)    Hepatitis    c   Pneumonia    Septic prepatellar bursitis of left knee 05/18/2017   Past Surgical History:  Procedure Laterality Date   CHOLECYSTECTOMY     I & D EXTREMITY Left 05/18/2017   Procedure: IRRIGATION AND DEBRIDEMENT INFECTED LEFT KNEE WITH PRE-PATELLA BURSECTOMY;  Surgeon: Marchia Bond, MD;  Location: [facility];  Service: Orthopedics;  Laterality: Left;   Patient Active Problem List   Diagnosis Date Noted   Septic prepatellar bursitis of left knee 05/18/2017   HEPATITIS C 02/07/2010   CIRRHOSIS 38/46/6599   ALCOHOLIC CIRRHOSIS OF LIVER 02/04/2010   UNSPECIFIED DISORDER OF LIVER 02/02/2010   REFERRING PROVIDER: Quay Burow, MD  REFERRING DIAG: Frailty  THERAPY DIAG:  Muscle weakness (generalized)  Other abnormalities of gait and mobility  Rationale for Evaluation and Treatment Rehabilitation  ONSET DATE: about a year ago  SUBJECTIVE:   SUBJECTIVE STATEMENT: Patient reports that she feels alright today.   PERTINENT HISTORY: Bipolar and HTN  PAIN:  Are you having pain? No  PRECAUTIONS: None  WEIGHT BEARING RESTRICTIONS No  OBJECTIVE:   UPPER EXTREMITY MMT:  MMT Right 9/6 Left 9/6  Shoulder flexion 4-/5 4-/5  Shoulder extension    Shoulder abduction    Shoulder adduction    Shoulder extension    Shoulder  internal rotation    Shoulder external rotation    Middle trapezius    Lower trapezius    Elbow flexion 4-/5 4-/5  Elbow extension 4-/5 4-/5  Wrist flexion    Wrist extension    Wrist ulnar deviation    Wrist radial deviation    Wrist pronation    Wrist supination    Grip strength 20 8   (Blank rows = not tested)   LOWER EXTREMITY MMT:  MMT Right 9/6 Left 9/6  Hip flexion 4-/5 4-/5  Hip extension    Hip abduction    Hip adduction    Hip internal rotation    Hip external rotation    Knee flexion 4/5 4/5  Knee extension 4+/5 4+/5  Ankle dorsiflexion 4/5 4/5  Ankle plantarflexion    Ankle inversion    Ankle eversion     (Blank rows = not tested)  TODAY'S TREATMENT:                                   9/20 EXERCISE LOG  Exercise Repetitions and Resistance Comments  Nustep L3 x 20 minutes   UBE 90 RPM x6 min (forward/backward)   Heel/toe raises X20 reps   Standing hip ABD X15 reps 2#  Greater weakness noted in LLE  Standing hip flexion X15 reps 2#   HS curs 2# x15 reps   Forward step up 6" x15 reps each   LAQ 4# x20 reps   Seated clams 30 reps green    Blank cell = exercise not performed today   TUG: 10 sec   PATIENT EDUCATION:  Education details: HEP, fatigue Person educated: Patient Education method: Explanation Education comprehension: verbalized understanding   HOME EXERCISE PROGRAM: [id number]  ASSESSMENT:  CLINICAL IMPRESSION: Patient progressed throughout therex with greater resistance and time. Patient reported feeling good throughout therex and no other complaints. Patient able to achieve TUG goal during today's session as well. Patient states that she also likes using ankle weights. Has been doing well recovering from PT treatments as well with no soreness and feeling capable of yardwork.  OBJECTIVE IMPAIRMENTS Abnormal gait, decreased activity tolerance, decreased endurance, decreased mobility, difficulty walking, decreased strength, impaired UE  functional use, and postural dysfunction.   ACTIVITY LIMITATIONS carrying, lifting, stairs, transfers, and locomotion level  PARTICIPATION LIMITATIONS: community activity  PERSONAL FACTORS Time since onset of injury/illness/exacerbation and 1-2 comorbidities: Bipolar and HTN  are also affecting patient's functional outcome.   REHAB POTENTIAL: Fair    CLINICAL DECISION MAKING: Stable/uncomplicated  EVALUATION COMPLEXITY: Low   GOALS: Goals reviewed with patient? Yes  SHORT TERM GOALS: Target date: 07/06/2022  Patient will be independent with her initial HEP.  Baseline: Goal status: MET  2.  Patient will be able to improve her five time sit to stand to 19 seconds or less. Baseline:  Goal status: MET  LONG TERM GOALS: Target date: 07/27/2022   Patient will be independent with her advanced HEP.  Baseline:  Goal status: INITIAL  2.  Patient will improve her TUG time to 12 seconds or less for improved safety.  Baseline:  Goal status: MET  3.  Patient will improve her five time sit to stand to 12 seconds or less for improved lower extremity power.  Baseline:  Goal status: MET  4.  Patient will be able to walk at least 20 minutes prior to being limited by fatigue.  Baseline:  Goal status: MET  5.  Patient will be able to walk at least 360 feet in three minutes for improved functional mobility.  Baseline:  Goal status: INITIAL  6.  Patient will be able to demonstrate at least 15 pounds of grip strength in her left hand.  Baseline:  Goal status: INITIAL   PLAN: PT FREQUENCY: 2x/week  PT DURATION: 6 weeks  PLANNED INTERVENTIONS: Therapeutic exercises, Therapeutic activity, Neuromuscular re-education, Balance training, Gait training, Patient/Family education, Self Care, Joint mobilization, Stair training, and Re-evaluation  PLAN FOR NEXT SESSION: nustep, UBE, global strengthening, and transfers   Standley Brooking, PTA 06/29/2022, 2:37 PM

## 2022-07-04 ENCOUNTER — Ambulatory Visit: Payer: Medicare Other

## 2022-07-04 DIAGNOSIS — M6281 Muscle weakness (generalized): Secondary | ICD-10-CM

## 2022-07-04 DIAGNOSIS — R2689 Other abnormalities of gait and mobility: Secondary | ICD-10-CM

## 2022-07-04 NOTE — Therapy (Signed)
OUTPATIENT PHYSICAL THERAPY LOWER EXTREMITY TREATMENT   Patient Name: Kim Harris MRN: [id number] DOB:10-28-1955, 66 y.o., female Today's Date: 07/04/2022   PT End of Session - 07/04/22 1352     Visit Number 6    Number of Visits 12    Date for PT Re-Evaluation 09/02/22    PT Start Time 1027    PT Stop Time 1430    PT Time Calculation (min) 45 min    Activity Tolerance Patient tolerated treatment well    Behavior During Therapy [facility] for tasks assessed/performed               Past Medical History:  Diagnosis Date   Anasarca    Anemia    Anxiety    Cirrhosis ([place])    Depression    HCV (hepatitis C virus)    Hepatitis    c   Pneumonia    Septic prepatellar bursitis of left knee 05/18/2017   Past Surgical History:  Procedure Laterality Date   CHOLECYSTECTOMY     I & D EXTREMITY Left 05/18/2017   Procedure: IRRIGATION AND DEBRIDEMENT INFECTED LEFT KNEE WITH PRE-PATELLA BURSECTOMY;  Surgeon: Marchia Bond, MD;  Location: [place];  Service: Orthopedics;  Laterality: Left;   Patient Active Problem List   Diagnosis Date Noted   Septic prepatellar bursitis of left knee 05/18/2017   HEPATITIS C 02/07/2010   CIRRHOSIS 25/36/6440   ALCOHOLIC CIRRHOSIS OF LIVER 02/04/2010   UNSPECIFIED DISORDER OF LIVER 02/02/2010   REFERRING PROVIDER: Quay Burow, MD  REFERRING DIAG: Frailty  THERAPY DIAG:  Muscle weakness (generalized)  Other abnormalities of gait and mobility  Rationale for Evaluation and Treatment Rehabilitation  ONSET DATE: about a year ago  SUBJECTIVE:   SUBJECTIVE STATEMENT: Patient reports feeling good today.  PERTINENT HISTORY: Bipolar and HTN  PAIN:  Are you having pain? No  PRECAUTIONS: None  WEIGHT BEARING RESTRICTIONS No  OBJECTIVE:   UPPER EXTREMITY MMT:  MMT Right 9/6 Left 9/6  Shoulder flexion 4-/5 4-/5  Shoulder extension    Shoulder abduction    Shoulder adduction    Shoulder extension    Shoulder internal rotation     Shoulder external rotation    Middle trapezius    Lower trapezius    Elbow flexion 4-/5 4-/5  Elbow extension 4-/5 4-/5  Wrist flexion    Wrist extension    Wrist ulnar deviation    Wrist radial deviation    Wrist pronation    Wrist supination    Grip strength 20 8   (Blank rows = not tested)   LOWER EXTREMITY MMT:  MMT Right 9/6 Left 9/6  Hip flexion 4-/5 4-/5  Hip extension    Hip abduction    Hip adduction    Hip internal rotation    Hip external rotation    Knee flexion 4/5 4/5  Knee extension 4+/5 4+/5  Ankle dorsiflexion 4/5 4/5  Ankle plantarflexion    Ankle inversion    Ankle eversion     (Blank rows = not tested)  TODAY'S TREATMENT:                                   9/25 EXERCISE LOG  Exercise Repetitions and Resistance Comments  Nustep L3 x 20 minutes   UBE 90 RPM x8 min (forward/backward)   Heel/toe raises x25 reps   Standing hip ABD x20 reps 2# Greater weakness noted  in LLE  Standing hip flexion x20 reps 2#   HS curls 2# x20 reps   Forward step up    LAQ 4# x20 reps   Seated clams 30 reps green   Ball Squeezes x3 mins    Blank cell = exercise not performed today    PATIENT EDUCATION:  Education details: HEP, fatigue Person educated: Patient Education method: Explanation Education comprehension: verbalized understanding   HOME EXERCISE PROGRAM: [id number]  ASSESSMENT:  CLINICAL IMPRESSION: Pt arrives for today's treatment session denying any pain.  Pt able to tolerate increased reps with numerous exercises today.  Pt exhibited increased fatigue with standing exercises, but able to perform all reps asked of her.  Pt reported slight increase in fatigue at completion of today's treatment session, but denies any pain.   OBJECTIVE IMPAIRMENTS Abnormal gait, decreased activity tolerance, decreased endurance, decreased mobility, difficulty walking, decreased strength, impaired UE functional use, and postural dysfunction.   ACTIVITY LIMITATIONS  carrying, lifting, stairs, transfers, and locomotion level  PARTICIPATION LIMITATIONS: community activity  PERSONAL FACTORS Time since onset of injury/illness/exacerbation and 1-2 comorbidities: Bipolar and HTN  are also affecting patient's functional outcome.   REHAB POTENTIAL: Fair    CLINICAL DECISION MAKING: Stable/uncomplicated  EVALUATION COMPLEXITY: Low   GOALS: Goals reviewed with patient? Yes  SHORT TERM GOALS: Target date: 07/06/2022  Patient will be independent with her initial HEP.  Baseline: Goal status: MET  2.  Patient will be able to improve her five time sit to stand to 19 seconds or less. Baseline:  Goal status: MET  LONG TERM GOALS: Target date: 07/27/2022   Patient will be independent with her advanced HEP.  Baseline:  Goal status: INITIAL  2.  Patient will improve her TUG time to 12 seconds or less for improved safety.  Baseline:  Goal status: MET  3.  Patient will improve her five time sit to stand to 12 seconds or less for improved lower extremity power.  Baseline:  Goal status: MET  4.  Patient will be able to walk at least 20 minutes prior to being limited by fatigue.  Baseline:  Goal status: MET  5.  Patient will be able to walk at least 360 feet in three minutes for improved functional mobility.  Baseline:  Goal status: INITIAL  6.  Patient will be able to demonstrate at least 15 pounds of grip strength in her left hand.  Baseline:  Goal status: INITIAL   PLAN: PT FREQUENCY: 2x/week  PT DURATION: 6 weeks  PLANNED INTERVENTIONS: Therapeutic exercises, Therapeutic activity, Neuromuscular re-education, Balance training, Gait training, Patient/Family education, Self Care, Joint mobilization, Stair training, and Re-evaluation  PLAN FOR NEXT SESSION: nustep, UBE, global strengthening, and transfers   Kathrynn Ducking, PTA 07/04/2022, 2:31 PM

## 2022-07-06 ENCOUNTER — Ambulatory Visit: Payer: Medicare Other | Admitting: Physical Therapy

## 2022-07-06 ENCOUNTER — Encounter: Payer: Self-pay | Admitting: Physical Therapy

## 2022-07-06 DIAGNOSIS — M6281 Muscle weakness (generalized): Secondary | ICD-10-CM

## 2022-07-06 DIAGNOSIS — R2689 Other abnormalities of gait and mobility: Secondary | ICD-10-CM

## 2022-07-06 NOTE — Therapy (Signed)
OUTPATIENT PHYSICAL THERAPY LOWER EXTREMITY TREATMENT   Patient Name: Kim Harris MRN: [id number] DOB:September 27, 1956, 66 y.o., female Today's Date: 07/06/2022   PT End of Session - 07/06/22 1401     Visit Number 7    Number of Visits 12    Date for PT Re-Evaluation 09/02/22    PT Start Time 1346    PT Stop Time 1430    PT Time Calculation (min) 44 min    Activity Tolerance Patient tolerated treatment well    Behavior During Therapy WFL for tasks assessed/performed               Past Medical History:  Diagnosis Date   Anasarca    Anemia    Anxiety    Cirrhosis ([place])    Depression    HCV (hepatitis C virus)    Hepatitis    c   Pneumonia    Septic prepatellar bursitis of left knee 05/18/2017   Past Surgical History:  Procedure Laterality Date   CHOLECYSTECTOMY     I & D EXTREMITY Left 05/18/2017   Procedure: IRRIGATION AND DEBRIDEMENT INFECTED LEFT KNEE WITH PRE-PATELLA BURSECTOMY;  Surgeon: Marchia Bond, MD;  Location: [place] [place];  Service: Orthopedics;  Laterality: Left;   Patient Active Problem List   Diagnosis Date Noted   Septic prepatellar bursitis of left knee 05/18/2017   HEPATITIS C 02/07/2010   CIRRHOSIS 37/48/2707   ALCOHOLIC CIRRHOSIS OF LIVER 02/04/2010   UNSPECIFIED DISORDER OF LIVER 02/02/2010   REFERRING PROVIDER: Quay Burow, MD  REFERRING DIAG: Frailty  THERAPY DIAG:  Muscle weakness (generalized)  Other abnormalities of gait and mobility  Rationale for Evaluation and Treatment Rehabilitation  ONSET DATE: about a year ago  SUBJECTIVE:   SUBJECTIVE STATEMENT: Patient reports feeling good today.  PERTINENT HISTORY: Bipolar and HTN  PAIN:  Are you having pain? No  PRECAUTIONS: None  WEIGHT BEARING RESTRICTIONS No  OBJECTIVE:   UPPER EXTREMITY MMT:  MMT Right 9/6 Left 9/6  Shoulder flexion 4-/5 4-/5  Shoulder extension    Shoulder abduction    Shoulder adduction    Shoulder extension    Shoulder internal rotation     Shoulder external rotation    Middle trapezius    Lower trapezius    Elbow flexion 4-/5 4-/5  Elbow extension 4-/5 4-/5  Wrist flexion    Wrist extension    Wrist ulnar deviation    Wrist radial deviation    Wrist pronation    Wrist supination    Grip strength 20 8   (Blank rows = not tested)   LOWER EXTREMITY MMT:  MMT Right 9/6 Left 9/6  Hip flexion 4-/5 4-/5  Hip extension    Hip abduction    Hip adduction    Hip internal rotation    Hip external rotation    Knee flexion 4/5 4/5  Knee extension 4+/5 4+/5  Ankle dorsiflexion 4/5 4/5  Ankle plantarflexion    Ankle inversion    Ankle eversion     (Blank rows = not tested)  TODAY'S TREATMENT:                                   9/25 EXERCISE LOG  Exercise Repetitions and Resistance Comments  Nustep L3 x 20 minutes   UBE 90 RPM x8 min (forward/backward)   Heel/toe raises x25 reps   Standing hip ABD x20 reps  Greater weakness noted  in LLE  LAQ 5# x20 reps   Sit <> stand with clam X15 reps green band   HS curl Green x20 reps    Blank cell = exercise not performed today    PATIENT EDUCATION:  Education details: HEP, fatigue Person educated: Patient Education method: Explanation Education comprehension: verbalized understanding   HOME EXERCISE PROGRAM: [id number]  ASSESSMENT:  CLINICAL IMPRESSION: Patient presented in clinic with no new complaints. Resistance and time increased for functional endurance today that patient was able to tolerate well. Moderate hip abductor weakness noted in standing as well as with sit <> stands with clam.  OBJECTIVE IMPAIRMENTS Abnormal gait, decreased activity tolerance, decreased endurance, decreased mobility, difficulty walking, decreased strength, impaired UE functional use, and postural dysfunction.   ACTIVITY LIMITATIONS carrying, lifting, stairs, transfers, and locomotion level  PARTICIPATION LIMITATIONS: community activity  PERSONAL FACTORS Time since onset of  injury/illness/exacerbation and 1-2 comorbidities: Bipolar and HTN  are also affecting patient's functional outcome.   REHAB POTENTIAL: Fair    CLINICAL DECISION MAKING: Stable/uncomplicated  EVALUATION COMPLEXITY: Low   GOALS: Goals reviewed with patient? Yes  SHORT TERM GOALS: Target date: 07/06/2022  Patient will be independent with her initial HEP.  Baseline: Goal status: MET  2.  Patient will be able to improve her five time sit to stand to 19 seconds or less. Baseline:  Goal status: MET  LONG TERM GOALS: Target date: 07/27/2022   Patient will be independent with her advanced HEP.  Baseline:  Goal status: INITIAL  2.  Patient will improve her TUG time to 12 seconds or less for improved safety.  Baseline:  Goal status: MET  3.  Patient will improve her five time sit to stand to 12 seconds or less for improved lower extremity power.  Baseline:  Goal status: MET  4.  Patient will be able to walk at least 20 minutes prior to being limited by fatigue.  Baseline:  Goal status: MET  5.  Patient will be able to walk at least 360 feet in three minutes for improved functional mobility.  Baseline:  Goal status: INITIAL  6.  Patient will be able to demonstrate at least 15 pounds of grip strength in her left hand.  Baseline:  Goal status: INITIAL   PLAN: PT FREQUENCY: 2x/week  PT DURATION: 6 weeks  PLANNED INTERVENTIONS: Therapeutic exercises, Therapeutic activity, Neuromuscular re-education, Balance training, Gait training, Patient/Family education, Self Care, Joint mobilization, Stair training, and Re-evaluation  PLAN FOR NEXT SESSION: nustep, UBE, global strengthening, and transfers   Standley Brooking, PTA 07/06/2022, 2:53 PM

## 2022-07-11 ENCOUNTER — Encounter: Payer: Self-pay | Admitting: Physical Therapy

## 2022-07-11 ENCOUNTER — Ambulatory Visit: Payer: Medicare Other | Attending: Gastroenterology | Admitting: Physical Therapy

## 2022-07-11 DIAGNOSIS — M6281 Muscle weakness (generalized): Secondary | ICD-10-CM | POA: Diagnosis present

## 2022-07-11 DIAGNOSIS — R2689 Other abnormalities of gait and mobility: Secondary | ICD-10-CM | POA: Diagnosis present

## 2022-07-11 NOTE — Therapy (Signed)
OUTPATIENT PHYSICAL THERAPY LOWER EXTREMITY TREATMENT   Patient Name: Kim Harris MRN: [id number] DOB:Jul 03, 1956, 66 y.o., female Today's Date: 07/11/2022   PT End of Session - 07/11/22 1353     Visit Number 8    Number of Visits 12    Date for PT Re-Evaluation 09/02/22    PT Start Time 4098    PT Stop Time 1191    PT Time Calculation (min) 42 min    Activity Tolerance Patient tolerated treatment well    Behavior During Therapy WFL for tasks assessed/performed               Past Medical History:  Diagnosis Date   Anasarca    Anemia    Anxiety    Cirrhosis ([place])    Depression    HCV (hepatitis C virus)    Hepatitis    c   Pneumonia    Septic prepatellar bursitis of left knee 05/18/2017   Past Surgical History:  Procedure Laterality Date   CHOLECYSTECTOMY     I & D EXTREMITY Left 05/18/2017   Procedure: IRRIGATION AND DEBRIDEMENT INFECTED LEFT KNEE WITH PRE-PATELLA BURSECTOMY;  Surgeon: Marchia Bond, MD;  Location: [place] [facility];  Service: Orthopedics;  Laterality: Left;   Patient Active Problem List   Diagnosis Date Noted   Septic prepatellar bursitis of left knee 05/18/2017   HEPATITIS C 02/07/2010   CIRRHOSIS 47/82/9562   ALCOHOLIC CIRRHOSIS OF LIVER 02/04/2010   UNSPECIFIED DISORDER OF LIVER 02/02/2010   REFERRING PROVIDER: Quay Burow, MD  REFERRING DIAG: Frailty  THERAPY DIAG:  Muscle weakness (generalized)  Other abnormalities of gait and mobility  Rationale for Evaluation and Treatment Rehabilitation  ONSET DATE: about a year ago  SUBJECTIVE:   SUBJECTIVE STATEMENT: Patient reports feeling good today.  PERTINENT HISTORY: Bipolar and HTN  PAIN:  Are you having pain? No  PRECAUTIONS: None  WEIGHT BEARING RESTRICTIONS No  OBJECTIVE:   UPPER EXTREMITY MMT:  MMT Right 9/6 Left 9/6  Shoulder flexion 4-/5 4-/5  Shoulder extension    Shoulder abduction    Shoulder adduction    Shoulder extension    Shoulder internal rotation     Shoulder external rotation    Middle trapezius    Lower trapezius    Elbow flexion 4-/5 4-/5  Elbow extension 4-/5 4-/5  Wrist flexion    Wrist extension    Wrist ulnar deviation    Wrist radial deviation    Wrist pronation    Wrist supination    Grip strength 20 8   (Blank rows = not tested)   LOWER EXTREMITY MMT:  MMT Right 9/6 Left 9/6  Hip flexion 4-/5 4-/5  Hip extension    Hip abduction    Hip adduction    Hip internal rotation    Hip external rotation    Knee flexion 4/5 4/5  Knee extension 4+/5 4+/5  Ankle dorsiflexion 4/5 4/5  Ankle plantarflexion    Ankle inversion    Ankle eversion     (Blank rows = not tested)  TODAY'S TREATMENT:                                   10/2 EXERCISE LOG  Exercise Repetitions and Resistance Comments  Nustep L3 x 20 minutes   UBE 60 RPM x8 min (forward/backward)   Resisted shoulder ext Green XTS x30 reps   Hip flexion 2# x20 reps  Standing hip ABD 2# x20 reps  Greater weakness noted in LLE  LAQ 5# x20 reps   Sit <> stand with clam X15 reps yellow band   Standing HS curl 2# x20 reps    Blank cell = exercise not performed today    PATIENT EDUCATION:  Education details: HEP, fatigue Person educated: Patient Education method: Explanation Education comprehension: verbalized understanding   HOME EXERCISE PROGRAM: [id number]  ASSESSMENT:  CLINICAL IMPRESSION: Patient presented in clinic today with no new complaints. Increase in resistance throughout therex with UE strengthening as well as standing exercises. Muscle fatigue noted with standing exercises. Functional strength improving with sit <> stands as she required less support to LEs via plinth table.  OBJECTIVE IMPAIRMENTS Abnormal gait, decreased activity tolerance, decreased endurance, decreased mobility, difficulty walking, decreased strength, impaired UE functional use, and postural dysfunction.   ACTIVITY LIMITATIONS carrying, lifting, stairs, transfers, and  locomotion level  PARTICIPATION LIMITATIONS: community activity  PERSONAL FACTORS Time since onset of injury/illness/exacerbation and 1-2 comorbidities: Bipolar and HTN  are also affecting patient's functional outcome.   REHAB POTENTIAL: Fair    CLINICAL DECISION MAKING: Stable/uncomplicated  EVALUATION COMPLEXITY: Low   GOALS: Goals reviewed with patient? Yes  SHORT TERM GOALS: Target date: 07/06/2022  Patient will be independent with her initial HEP.  Baseline: Goal status: MET  2.  Patient will be able to improve her five time sit to stand to 19 seconds or less. Baseline:  Goal status: MET  LONG TERM GOALS: Target date: 07/27/2022   Patient will be independent with her advanced HEP.  Baseline:  Goal status: INITIAL  2.  Patient will improve her TUG time to 12 seconds or less for improved safety.  Baseline:  Goal status: MET  3.  Patient will improve her five time sit to stand to 12 seconds or less for improved lower extremity power.  Baseline:  Goal status: MET  4.  Patient will be able to walk at least 20 minutes prior to being limited by fatigue.  Baseline:  Goal status: MET  5.  Patient will be able to walk at least 360 feet in three minutes for improved functional mobility.  Baseline:  Goal status: INITIAL  6.  Patient will be able to demonstrate at least 15 pounds of grip strength in her left hand.  Baseline:  Goal status: INITIAL   PLAN: PT FREQUENCY: 2x/week  PT DURATION: 6 weeks  PLANNED INTERVENTIONS: Therapeutic exercises, Therapeutic activity, Neuromuscular re-education, Balance training, Gait training, Patient/Family education, Self Care, Joint mobilization, Stair training, and Re-evaluation  PLAN FOR NEXT SESSION: nustep, UBE, global strengthening, and transfers   Standley Brooking, PTA 07/11/2022, 2:37 PM

## 2022-07-13 ENCOUNTER — Ambulatory Visit: Payer: Medicare Other | Admitting: Physical Therapy

## 2022-07-13 ENCOUNTER — Encounter: Payer: Self-pay | Admitting: Physical Therapy

## 2022-07-13 DIAGNOSIS — M6281 Muscle weakness (generalized): Secondary | ICD-10-CM | POA: Diagnosis not present

## 2022-07-13 DIAGNOSIS — R2689 Other abnormalities of gait and mobility: Secondary | ICD-10-CM

## 2022-07-13 NOTE — Therapy (Signed)
OUTPATIENT PHYSICAL THERAPY LOWER EXTREMITY TREATMENT   Patient Name: Kim Harris MRN: [id number] DOB:11/09/1955, 66 y.o., female Today's Date: 07/13/2022   PT End of Session - 07/13/22 1354     Visit Number 9    Number of Visits 12    Date for PT Re-Evaluation 09/02/22    PT Start Time 3704    PT Stop Time 1427    PT Time Calculation (min) 41 min    Activity Tolerance Patient tolerated treatment well    Behavior During Therapy [facility] for tasks assessed/performed            Past Medical History:  Diagnosis Date   Anasarca    Anemia    Anxiety    Cirrhosis ([place])    Depression    HCV (hepatitis C virus)    Hepatitis    c   Pneumonia    Septic prepatellar bursitis of left knee 05/18/2017   Past Surgical History:  Procedure Laterality Date   CHOLECYSTECTOMY     I & D EXTREMITY Left 05/18/2017   Procedure: IRRIGATION AND DEBRIDEMENT INFECTED LEFT KNEE WITH PRE-PATELLA BURSECTOMY;  Surgeon: Marchia Bond, MD;  Location: [place];  Service: Orthopedics;  Laterality: Left;   Patient Active Problem List   Diagnosis Date Noted   Septic prepatellar bursitis of left knee 05/18/2017   HEPATITIS C 02/07/2010   CIRRHOSIS 88/89/1694   ALCOHOLIC CIRRHOSIS OF LIVER 02/04/2010   UNSPECIFIED DISORDER OF LIVER 02/02/2010   REFERRING PROVIDER: Quay Burow, MD  REFERRING DIAG: Frailty  THERAPY DIAG:  Muscle weakness (generalized)  Other abnormalities of gait and mobility  Rationale for Evaluation and Treatment Rehabilitation  ONSET DATE: about a year ago  SUBJECTIVE:   SUBJECTIVE STATEMENT: Patient reports feeling good today. Feels stronger.  PERTINENT HISTORY: Bipolar and HTN  PAIN:  Are you having pain? No  PRECAUTIONS: None  WEIGHT BEARING RESTRICTIONS No  OBJECTIVE:   UPPER EXTREMITY MMT:  MMT Right 9/6 Left 9/6 Right 10/4 Left 10/4  Shoulder flexion 4-/5 4-/5    Shoulder extension      Shoulder abduction      Shoulder adduction      Shoulder  extension      Shoulder internal rotation      Shoulder external rotation      Middle trapezius      Lower trapezius      Elbow flexion 4-/5 4-/5    Elbow extension 4-/5 4-/5    Wrist flexion      Wrist extension      Wrist ulnar deviation      Wrist radial deviation      Wrist pronation      Wrist supination      Grip strength 20 8 21 15    (Blank rows = not tested)   LOWER EXTREMITY MMT:  MMT Right 9/6 Left 9/6  Hip flexion 4-/5 4-/5  Hip extension    Hip abduction    Hip adduction    Hip internal rotation    Hip external rotation    Knee flexion 4/5 4/5  Knee extension 4+/5 4+/5  Ankle dorsiflexion 4/5 4/5  Ankle plantarflexion    Ankle inversion    Ankle eversion     (Blank rows = not tested)  TODAY'S TREATMENT:                                   10/4  EXERCISE LOG  Exercise Repetitions and Resistance Comments  Nustep L3 x 20 minutes   UBE 60 RPM x6 min (forward/backward)   Hip flexion 2# x20 reps   Standing hip ABD 2# x20 reps  Greater weakness noted in LLE  Forward step up 6" step x15 reps   Lateral step up 6" step x15 reps Very fatigued   Blank cell = exercise not performed today    PATIENT EDUCATION:  Education details: HEP, fatigue Person educated: Patient Education method: Explanation Education comprehension: verbalized understanding   HOME EXERCISE PROGRAM: [id number]  ASSESSMENT:  CLINICAL IMPRESSION: Patient overall more fatigued today as resistance was increased with aerobic warm ups. Patient observed with increased edema in L hand today as well as seen on posterior hand. Patient's R grip strength increased from evaluation as listed. Patient states that she feels safe with everything at home and could not recall any ADL in which she felt was difficult. Has to ascend and descend stairs at home multiple times a day.  OBJECTIVE IMPAIRMENTS Abnormal gait, decreased activity tolerance, decreased endurance, decreased mobility, difficulty walking,  decreased strength, impaired UE functional use, and postural dysfunction.   ACTIVITY LIMITATIONS carrying, lifting, stairs, transfers, and locomotion level  PARTICIPATION LIMITATIONS: community activity  PERSONAL FACTORS Time since onset of injury/illness/exacerbation and 1-2 comorbidities: Bipolar and HTN  are also affecting patient's functional outcome.   REHAB POTENTIAL: Fair    CLINICAL DECISION MAKING: Stable/uncomplicated  EVALUATION COMPLEXITY: Low   GOALS: Goals reviewed with patient? Yes  SHORT TERM GOALS: Target date: 07/06/2022  Patient will be independent with her initial HEP.  Baseline: Goal status: MET  2.  Patient will be able to improve her five time sit to stand to 19 seconds or less. Baseline:  Goal status: MET  LONG TERM GOALS: Target date: 07/27/2022   Patient will be independent with her advanced HEP.  Baseline:  Goal status: On-going  2.  Patient will improve her TUG time to 12 seconds or less for improved safety.  Baseline:  Goal status: MET  3.  Patient will improve her five time sit to stand to 12 seconds or less for improved lower extremity power.  Baseline:  Goal status: MET  4.  Patient will be able to walk at least 20 minutes prior to being limited by fatigue.  Baseline:  Goal status: MET  5.  Patient will be able to walk at least 360 feet in three minutes for improved functional mobility.  Baseline:  Goal status: On-going  6.  Patient will be able to demonstrate at least 15 pounds of grip strength in her left hand.  Baseline:  Goal status: MET  PLAN: PT FREQUENCY: 2x/week  PT DURATION: 6 weeks  PLANNED INTERVENTIONS: Therapeutic exercises, Therapeutic activity, Neuromuscular re-education, Balance training, Gait training, Patient/Family education, Self Care, Joint mobilization, Stair training, and Re-evaluation  PLAN FOR NEXT SESSION: nustep, UBE, global strengthening, and transfers   Standley Brooking, PTA 07/13/2022, 2:33 PM

## 2022-07-18 ENCOUNTER — Encounter: Payer: Self-pay | Admitting: Physical Therapy

## 2022-07-18 ENCOUNTER — Ambulatory Visit: Payer: Medicare Other | Admitting: Physical Therapy

## 2022-07-18 DIAGNOSIS — M6281 Muscle weakness (generalized): Secondary | ICD-10-CM | POA: Diagnosis not present

## 2022-07-18 DIAGNOSIS — R2689 Other abnormalities of gait and mobility: Secondary | ICD-10-CM

## 2022-07-18 NOTE — Therapy (Signed)
OUTPATIENT PHYSICAL THERAPY LOWER EXTREMITY TREATMENT   Patient Name: Kim Harris MRN: [id number] DOB:10/25/1955, 66 y.o., female Today's Date: 07/18/2022   PT End of Session - 07/18/22 1348     Visit Number 10    Number of Visits 12    Date for PT Re-Evaluation 09/02/22    PT Start Time 1347    PT Stop Time 3086    PT Time Calculation (min) 41 min    Activity Tolerance Patient tolerated treatment well    Behavior During Therapy [facility] for tasks assessed/performed            Past Medical History:  Diagnosis Date   Anasarca    Anemia    Anxiety    Cirrhosis ([place])    Depression    HCV (hepatitis C virus)    Hepatitis    c   Pneumonia    Septic prepatellar bursitis of left knee 05/18/2017   Past Surgical History:  Procedure Laterality Date   CHOLECYSTECTOMY     I & D EXTREMITY Left 05/18/2017   Procedure: IRRIGATION AND DEBRIDEMENT INFECTED LEFT KNEE WITH PRE-PATELLA BURSECTOMY;  Surgeon: Marchia Bond, MD;  Location: [place];  Service: Orthopedics;  Laterality: Left;   Patient Active Problem List   Diagnosis Date Noted   Septic prepatellar bursitis of left knee 05/18/2017   HEPATITIS C 02/07/2010   CIRRHOSIS 57/84/6962   ALCOHOLIC CIRRHOSIS OF LIVER 02/04/2010   UNSPECIFIED DISORDER OF LIVER 02/02/2010   REFERRING PROVIDER: Quay Burow, MD  REFERRING DIAG: Frailty  THERAPY DIAG:  Muscle weakness (generalized)  Other abnormalities of gait and mobility  Rationale for Evaluation and Treatment Rehabilitation  ONSET DATE: about a year ago  SUBJECTIVE:   SUBJECTIVE STATEMENT: Patient reports feeling good today.   PERTINENT HISTORY: Bipolar and HTN  PAIN:  Are you having pain? No  PRECAUTIONS: None  WEIGHT BEARING RESTRICTIONS No  OBJECTIVE:   UPPER EXTREMITY MMT:  MMT Right 9/6 Left 9/6 Right 10/4 Left 10/4  Shoulder flexion 4-/5 4-/5    Shoulder extension      Shoulder abduction      Shoulder adduction      Shoulder extension       Shoulder internal rotation      Shoulder external rotation      Middle trapezius      Lower trapezius      Elbow flexion 4-/5 4-/5    Elbow extension 4-/5 4-/5    Wrist flexion      Wrist extension      Wrist ulnar deviation      Wrist radial deviation      Wrist pronation      Wrist supination      Grip strength 20 8 21 15    (Blank rows = not tested)   LOWER EXTREMITY MMT:  MMT Right 9/6 Left 9/6  Hip flexion 4-/5 4-/5  Hip extension    Hip abduction    Hip adduction    Hip internal rotation    Hip external rotation    Knee flexion 4/5 4/5  Knee extension 4+/5 4+/5  Ankle dorsiflexion 4/5 4/5  Ankle plantarflexion    Ankle inversion    Ankle eversion     (Blank rows = not tested)  TODAY'S TREATMENT:                                   10/9 EXERCISE  LOG  Exercise Repetitions and Resistance Comments  Nustep L5 x 20 minutes   UBE 60 RPM x6 min (forward/backward)   Shoulder extension Blue XTS x20 reps   Hip flexion 3# x20 reps   Standing hip ABD 3# x20 reps  Greater weakness noted in LLE  Mini squats X20 reps   Sidestep Yellow x5 RT   Shoulder row Yellow x20 reps   LAQ 5# x20 reps   HS curl 5# x20 reps    Blank cell = exercise not performed today    PATIENT EDUCATION:  Education details: HEP, fatigue Person educated: Patient Education method: Explanation Education comprehension: verbalized understanding   HOME EXERCISE PROGRAM: [id number]  ASSESSMENT:  CLINICAL IMPRESSION: Patient was progressed today with resistance and other dynamic, functional training. Patient had no complaints during therex and required minimal cueing. Patient was observed standing in B knee flexion. Patient reported feeling good following treatment with some fatigue.  OBJECTIVE IMPAIRMENTS Abnormal gait, decreased activity tolerance, decreased endurance, decreased mobility, difficulty walking, decreased strength, impaired UE functional use, and postural dysfunction.   ACTIVITY  LIMITATIONS carrying, lifting, stairs, transfers, and locomotion level  PARTICIPATION LIMITATIONS: community activity  PERSONAL FACTORS Time since onset of injury/illness/exacerbation and 1-2 comorbidities: Bipolar and HTN  are also affecting patient's functional outcome.   REHAB POTENTIAL: Fair    CLINICAL DECISION MAKING: Stable/uncomplicated  EVALUATION COMPLEXITY: Low   GOALS: Goals reviewed with patient? Yes  SHORT TERM GOALS: Target date: 07/06/2022  Patient will be independent with her initial HEP.  Baseline: Goal status: MET  2.  Patient will be able to improve her five time sit to stand to 19 seconds or less. Baseline:  Goal status: MET  LONG TERM GOALS: Target date: 07/27/2022   Patient will be independent with her advanced HEP.  Baseline:  Goal status: On-going  2.  Patient will improve her TUG time to 12 seconds or less for improved safety.  Baseline:  Goal status: MET  3.  Patient will improve her five time sit to stand to 12 seconds or less for improved lower extremity power.  Baseline:  Goal status: MET  4.  Patient will be able to walk at least 20 minutes prior to being limited by fatigue.  Baseline:  Goal status: MET  5.  Patient will be able to walk at least 360 feet in three minutes for improved functional mobility.  Baseline:  Goal status: On-going  6.  Patient will be able to demonstrate at least 15 pounds of grip strength in her left hand.  Baseline:  Goal status: MET  PLAN: PT FREQUENCY: 2x/week  PT DURATION: 6 weeks  PLANNED INTERVENTIONS: Therapeutic exercises, Therapeutic activity, Neuromuscular re-education, Balance training, Gait training, Patient/Family education, Self Care, Joint mobilization, Stair training, and Re-evaluation  PLAN FOR NEXT SESSION: nustep, UBE, global strengthening, and transfers   Standley Brooking, PTA 07/18/2022, 2:43 PM

## 2022-07-20 ENCOUNTER — Ambulatory Visit: Payer: Medicare Other

## 2022-07-20 DIAGNOSIS — M6281 Muscle weakness (generalized): Secondary | ICD-10-CM

## 2022-07-20 DIAGNOSIS — R2689 Other abnormalities of gait and mobility: Secondary | ICD-10-CM

## 2022-07-20 NOTE — Therapy (Signed)
OUTPATIENT PHYSICAL THERAPY LOWER EXTREMITY TREATMENT   Patient Name: Kim Harris MRN: [id number] DOB:10-Mar-1956, 66 y.o., female Today's Date: 07/20/2022   PT End of Session - 07/20/22 1348     Visit Number 11    Number of Visits 12    Date for PT Re-Evaluation 09/02/22    PT Start Time 1345    PT Stop Time 1430    PT Time Calculation (min) 45 min    Activity Tolerance Patient tolerated treatment well    Behavior During Therapy [facility] for tasks assessed/performed            Past Medical History:  Diagnosis Date   Anasarca    Anemia    Anxiety    Cirrhosis ([place])    Depression    HCV (hepatitis C virus)    Hepatitis    c   Pneumonia    Septic prepatellar bursitis of left knee 05/18/2017   Past Surgical History:  Procedure Laterality Date   CHOLECYSTECTOMY     I & D EXTREMITY Left 05/18/2017   Procedure: IRRIGATION AND DEBRIDEMENT INFECTED LEFT KNEE WITH PRE-PATELLA BURSECTOMY;  Surgeon: Marchia Bond, MD;  Location: [place];  Service: Orthopedics;  Laterality: Left;   Patient Active Problem List   Diagnosis Date Noted   Septic prepatellar bursitis of left knee 05/18/2017   HEPATITIS C 02/07/2010   CIRRHOSIS 50/38/8828   ALCOHOLIC CIRRHOSIS OF LIVER 02/04/2010   UNSPECIFIED DISORDER OF LIVER 02/02/2010   REFERRING PROVIDER: Quay Burow, MD  REFERRING DIAG: Frailty  THERAPY DIAG:  Muscle weakness (generalized)  Other abnormalities of gait and mobility  Rationale for Evaluation and Treatment Rehabilitation  ONSET DATE: about a year ago  SUBJECTIVE:   SUBJECTIVE STATEMENT: Patient reports that she feels good and that she is able to get around better than she was prior to therapy.   PERTINENT HISTORY: Bipolar and HTN  PAIN:  Are you having pain? No  PRECAUTIONS: None  WEIGHT BEARING RESTRICTIONS No  OBJECTIVE:   UPPER EXTREMITY MMT:  MMT Right 9/6 Left 9/6 Right 10/4 Left 10/4  Shoulder flexion 4-/5 4-/5    Shoulder extension       Shoulder abduction      Shoulder adduction      Shoulder extension      Shoulder internal rotation      Shoulder external rotation      Middle trapezius      Lower trapezius      Elbow flexion 4-/5 4-/5    Elbow extension 4-/5 4-/5    Wrist flexion      Wrist extension      Wrist ulnar deviation      Wrist radial deviation      Wrist pronation      Wrist supination      Grip strength 20 8 21 15    (Blank rows = not tested)   LOWER EXTREMITY MMT:  MMT Right 9/6 Left 9/6  Hip flexion 4-/5 4-/5  Hip extension    Hip abduction    Hip adduction    Hip internal rotation    Hip external rotation    Knee flexion 4/5 4/5  Knee extension 4+/5 4+/5  Ankle dorsiflexion 4/5 4/5  Ankle plantarflexion    Ankle inversion    Ankle eversion     (Blank rows = not tested)  TODAY'S TREATMENT:  10/11  EXERCISE LOG  Exercise Repetitions and Resistance Comments  Nustep  L4 x 15 minutes   Chair taps 30 reps   B shoulder ER  Green t-band x 20 reps   Horizontal ABD  Red t-band x 20 reps   Seated HS curl Green t-band x 20 reps each    Lateral step up 6" step x 2 minutes    UBE  X8 minutes @ 60 RPM    Blank cell = exercise not performed today                                    10/9 EXERCISE LOG  Exercise Repetitions and Resistance Comments  Nustep L5 x 20 minutes   UBE 60 RPM x6 min (forward/backward)   Shoulder extension Blue XTS x20 reps   Hip flexion 3# x20 reps   Standing hip ABD 3# x20 reps  Greater weakness noted in LLE  Mini squats X20 reps   Sidestep Yellow x5 RT   Shoulder row Yellow x20 reps   LAQ 5# x20 reps   HS curl 5# x20 reps    Blank cell = exercise not performed today    PATIENT EDUCATION:  Education details: HEP, fatigue Person educated: Patient Education method: Explanation Education comprehension: verbalized understanding   HOME EXERCISE PROGRAM: [email or web address]  ASSESSMENT:  CLINICAL IMPRESSION: Patient was progressed  with multiple new and familiar interventions for improved upper and lower extremity strength and endurance with moderate difficulty. She required minimal cueing with today's interventions for proper biomechanics to promote proper exercise performance. Her HEP was updated to include some of today's new interventions as she was able to safely and properly demonstrate these interventions. She reported feeling comfortable with these new exercises. She reported feeling alright upon the conclusion of treatment. Recommend that she continue with skilled physical therapy to address her remaining impairments to maximize her functional mobility.   OBJECTIVE IMPAIRMENTS Abnormal gait, decreased activity tolerance, decreased endurance, decreased mobility, difficulty walking, decreased strength, impaired UE functional use, and postural dysfunction.   ACTIVITY LIMITATIONS carrying, lifting, stairs, transfers, and locomotion level  PARTICIPATION LIMITATIONS: community activity  PERSONAL FACTORS Time since onset of injury/illness/exacerbation and 1-2 comorbidities: Bipolar and HTN  are also affecting patient's functional outcome.   REHAB POTENTIAL: Fair    CLINICAL DECISION MAKING: Stable/uncomplicated  EVALUATION COMPLEXITY: Low   GOALS: Goals reviewed with patient? Yes  SHORT TERM GOALS: Target date: 07/06/2022  Patient will be independent with her initial HEP.  Baseline: Goal status: MET  2.  Patient will be able to improve her five time sit to stand to 19 seconds or less. Baseline:  Goal status: MET  LONG TERM GOALS: Target date: 07/27/2022   Patient will be independent with her advanced HEP.  Baseline:  Goal status: On-going  2.  Patient will improve her TUG time to 12 seconds or less for improved safety.  Baseline:  Goal status: MET  3.  Patient will improve her five time sit to stand to 12 seconds or less for improved lower extremity power.  Baseline:  Goal status: MET  4.  Patient  will be able to walk at least 20 minutes prior to being limited by fatigue.  Baseline:  Goal status: MET  5.  Patient will be able to walk at least 360 feet in three minutes for improved functional mobility.  Baseline:  Goal status: On-going  6.  Patient  will be able to demonstrate at least 15 pounds of grip strength in her left hand.  Baseline:  Goal status: MET  PLAN: PT FREQUENCY: 2x/week  PT DURATION: 6 weeks  PLANNED INTERVENTIONS: Therapeutic exercises, Therapeutic activity, Neuromuscular re-education, Balance training, Gait training, Patient/Family education, Self Care, Joint mobilization, Stair training, and Re-evaluation  PLAN FOR NEXT SESSION: nustep, UBE, global strengthening, and transfers   Darlin Coco, PT 07/20/2022, 3:12 PM

## 2022-07-25 ENCOUNTER — Encounter: Payer: Self-pay | Admitting: Physical Therapy

## 2022-07-25 ENCOUNTER — Ambulatory Visit: Payer: Medicare Other | Admitting: Physical Therapy

## 2022-07-25 DIAGNOSIS — M6281 Muscle weakness (generalized): Secondary | ICD-10-CM

## 2022-07-25 DIAGNOSIS — R2689 Other abnormalities of gait and mobility: Secondary | ICD-10-CM

## 2022-07-25 NOTE — Therapy (Addendum)
OUTPATIENT PHYSICAL THERAPY LOWER EXTREMITY TREATMENT   Patient Name: Kim Harris MRN: [id number] DOB:04-17-1956, 66 y.o., female Today's Date: 07/25/2022   PT End of Session - 07/25/22 1344     Visit Number 12    Number of Visits 12    Date for PT Re-Evaluation 09/02/22    PT Start Time 1347    PT Stop Time 1430    PT Time Calculation (min) 43 min    Activity Tolerance Patient tolerated treatment well    Behavior During Therapy [facility] for tasks assessed/performed            Past Medical History:  Diagnosis Date   Anasarca    Anemia    Anxiety    Cirrhosis ([place])    Depression    HCV (hepatitis C virus)    Hepatitis    c   Pneumonia    Septic prepatellar bursitis of left knee 05/18/2017   Past Surgical History:  Procedure Laterality Date   CHOLECYSTECTOMY     I & D EXTREMITY Left 05/18/2017   Procedure: IRRIGATION AND DEBRIDEMENT INFECTED LEFT KNEE WITH PRE-PATELLA BURSECTOMY;  Surgeon: Marchia Bond, MD;  Location: [place];  Service: Orthopedics;  Laterality: Left;   Patient Active Problem List   Diagnosis Date Noted   Septic prepatellar bursitis of left knee 05/18/2017   HEPATITIS C 02/07/2010   CIRRHOSIS 15/94/5859   ALCOHOLIC CIRRHOSIS OF LIVER 02/04/2010   UNSPECIFIED DISORDER OF LIVER 02/02/2010   REFERRING PROVIDER: Quay Burow, MD  REFERRING DIAG: Frailty  THERAPY DIAG:  Muscle weakness (generalized)  Other abnormalities of gait and mobility  Rationale for Evaluation and Treatment Rehabilitation  ONSET DATE: about a year ago  SUBJECTIVE:   SUBJECTIVE STATEMENT: Has two HEPs but feels good about everything at home.  PERTINENT HISTORY: Bipolar and HTN  PAIN:  Are you having pain? No  PRECAUTIONS: None  WEIGHT BEARING RESTRICTIONS No  OBJECTIVE:   UPPER EXTREMITY MMT:  MMT Right 9/6 Left 9/6 Right 10/4 Left 10/4  Shoulder flexion 4-/5 4-/5    Shoulder extension      Shoulder abduction      Shoulder adduction       Shoulder extension      Shoulder internal rotation      Shoulder external rotation      Middle trapezius      Lower trapezius      Elbow flexion 4-/5 4-/5    Elbow extension 4-/5 4-/5    Wrist flexion      Wrist extension      Wrist ulnar deviation      Wrist radial deviation      Wrist pronation      Wrist supination      Grip strength 20 8 21 15    (Blank rows = not tested)   LOWER EXTREMITY MMT:  MMT Right 9/6 Left 9/6  Hip flexion 4-/5 4-/5  Hip extension    Hip abduction    Hip adduction    Hip internal rotation    Hip external rotation    Knee flexion 4/5 4/5  Knee extension 4+/5 4+/5  Ankle dorsiflexion 4/5 4/5  Ankle plantarflexion    Ankle inversion    Ankle eversion     (Blank rows = not tested)  TODAY'S TREATMENT:  10/16 EXERCISE LOG  Exercise Repetitions and Resistance Comments  Nustep L4 x 21 minutes   UBE 60 RPM x8 min (forward/backward)   Shoulder extension Blue XTS x20 reps   Knee flexors 20# x20 reps   Knee extensors 10# x20 reps    Blank cell = exercise not performed today   Gait: 3 MWT- 510 ft   PATIENT EDUCATION:  Education details: HEP, fatigue Person educated: Patient Education method: Explanation Education comprehension: verbalized understanding   HOME EXERCISE PROGRAM: [id number]  ASSESSMENT:  CLINICAL IMPRESSION: Patient has made great functional improvements since beginning PT. Patient now feels safer with all activities at home. Patient has two HEPs and had no questions with either HEP. Patient has also been able to achieve all STGs and LTGs at this time. No complaints during therex but was advanced to machine LE strengthening today.  PHYSICAL THERAPY DISCHARGE SUMMARY  Visits from Start of Care: 12  Current functional level related to goals / functional outcomes: Patient was able to meet most of her goals for skilled physical therapy.    Remaining deficits: Endurance and gait speed    Education / Equipment: HEP    Patient agrees to discharge. Patient goals were partially met. Patient is being discharged due to being pleased with the current functional level.  Jacqulynn Cadet, PT, DPT    OBJECTIVE IMPAIRMENTS Abnormal gait, decreased activity tolerance, decreased endurance, decreased mobility, difficulty walking, decreased strength, impaired UE functional use, and postural dysfunction.   ACTIVITY LIMITATIONS carrying, lifting, stairs, transfers, and locomotion level  PARTICIPATION LIMITATIONS: community activity  PERSONAL FACTORS Time since onset of injury/illness/exacerbation and 1-2 comorbidities: Bipolar and HTN  are also affecting patient's functional outcome.   REHAB POTENTIAL: Fair    CLINICAL DECISION MAKING: Stable/uncomplicated  EVALUATION COMPLEXITY: Low   GOALS: Goals reviewed with patient? Yes  SHORT TERM GOALS: Target date: 07/06/2022  Patient will be independent with her initial HEP.  Baseline: Goal status: MET  2.  Patient will be able to improve her five time sit to stand to 19 seconds or less. Baseline:  Goal status: MET  LONG TERM GOALS: Target date: 07/27/2022   Patient will be independent with her advanced HEP.  Baseline:  Goal status: MET  2.  Patient will improve her TUG time to 12 seconds or less for improved safety.  Baseline:  Goal status: MET  3.  Patient will improve her five time sit to stand to 12 seconds or less for improved lower extremity power.  Baseline:  Goal status: MET  4.  Patient will be able to walk at least 20 minutes prior to being limited by fatigue.  Baseline:  Goal status: MET  5.  Patient will be able to walk at least 360 feet in three minutes for improved functional mobility.  Baseline:  Goal status: On-going  6.  Patient will be able to demonstrate at least 15 pounds of grip strength in her left hand.  Baseline:  Goal status: MET  PLAN: PT FREQUENCY: 2x/week  PT DURATION: 6  weeks  PLANNED INTERVENTIONS: Therapeutic exercises, Therapeutic activity, Neuromuscular re-education, Balance training, Gait training, Patient/Family education, Self Care, Joint mobilization, Stair training, and Re-evaluation  PLAN FOR NEXT SESSION: [place], PTA 07/25/2022, 3:18 PM

## 2024-06-19 ENCOUNTER — Ambulatory Visit: Attending: Gastroenterology | Admitting: Physical Therapy

## 2024-06-19 ENCOUNTER — Encounter: Payer: Self-pay | Admitting: Physical Therapy

## 2024-06-19 ENCOUNTER — Other Ambulatory Visit: Payer: Self-pay

## 2024-06-19 DIAGNOSIS — R2689 Other abnormalities of gait and mobility: Secondary | ICD-10-CM | POA: Diagnosis present

## 2024-06-19 DIAGNOSIS — M6281 Muscle weakness (generalized): Secondary | ICD-10-CM | POA: Insufficient documentation

## 2024-06-19 NOTE — Therapy (Signed)
 OUTPATIENT PHYSICAL THERAPY LOWER EXTREMITY EVALUATION   Patient Name: Kim Harris MRN: [id number] DOB:1956/09/11, 68 y.o., female Today's Date: 06/19/2024  END OF SESSION:  PT End of Session - 06/19/24 1335     Visit Number 1    Number of Visits 12    Date for PT Re-Evaluation 07/31/24    PT Start Time 0100    PT Stop Time 0129    PT Time Calculation (min) 29 min    Activity Tolerance Patient tolerated treatment well    Behavior During Therapy [facility] for tasks assessed/performed          Past Medical History:  Diagnosis Date   Anasarca    Anemia    Anxiety    Cirrhosis (HCC)    Depression    HCV (hepatitis C virus)    Hepatitis    c   Pneumonia    Septic prepatellar bursitis of left knee 05/18/2017   Past Surgical History:  Procedure Laterality Date   CHOLECYSTECTOMY     I & D EXTREMITY Left 05/18/2017   Procedure: IRRIGATION AND DEBRIDEMENT INFECTED LEFT KNEE WITH PRE-PATELLA BURSECTOMY;  Surgeon: Josefina Chew, MD;  Location: [facility] OR;  Service: Orthopedics;  Laterality: Left;   Patient Active Problem List   Diagnosis Date Noted   Septic prepatellar bursitis of left knee 05/18/2017   HEPATITIS C 02/07/2010   Hepatic cirrhosis (HCC) 02/07/2010   ALCOHOLIC CIRRHOSIS OF LIVER 02/04/2010   Disorder of liver 02/02/2010   REFERRING PROVIDER: RONAL Donna Magnuson MD  REFERRING DIAG: Debility, weakness.  THERAPY DIAG:  Muscle weakness (generalized)  Other abnormalities of gait and mobility  Rationale for Evaluation and Treatment: Rehabilitation  ONSET DATE: Ongoing.    SUBJECTIVE:   SUBJECTIVE STATEMENT: The patient presents to the clinic with c/o weakness.  She has had PT previously and found it helpful.  This has been an ongoing problem since 2021 and she reports lower energy due to liver issues.  02 sat at rest is 94% and RHR is 83 bpm.    PERTINENT HISTORY: Please see above.   PAIN:  Are you having pain? No  PRECAUTIONS: Fall.  Patient had a fall  related to leaning on a unstable table and once when she felt exhausted.  Recommended she use a cane.    RED FLAGS: None   WEIGHT BEARING RESTRICTIONS: No  FALLS:  Has patient fallen in last 6 months? Yes. Number of falls 2.  LIVING ENVIRONMENT: Lives in: House/apartment Has following equipment at home: None  PLOF: Independent with basic ADLs  PATIENT GOALS: Get stronger.    OBJECTIVE:  POSTURE: rounded shoulders, forward head, and flexed trunk    LOWER/UPPER EXTREMITY ROM:  WFL. LOWER/UPPER EXTREMITY MMT:  Bilateral deltoid strength is 4-/5, bilateral elbow flexion is solid 4/5, right grip is 18# and left is 14#.  Bilateral hip flexion strength is 4/5, bilateral knee extension is 4+/5.  Normal bilateral ankle strength.  FUNCTIONAL TESTS:  5 times sit to stand: 13 seconds Timed up and go (TUG): 18 seconds.  GAIT: Patient walks with a normal cadence but has decreased foot clearance.  Recommended she use a cane at all times for safety.  TREATMENT DATE:     PATIENT EDUCATION:  Education details:  Person educated:  International aid/development worker:  Education comprehension:   HOME EXERCISE PROGRAM:   ASSESSMENT:  CLINICAL IMPRESSION: The patient presents to [facility] with c/o weakness.  Strongly recommended she use a cane at all times for safety.  Her TUG is 18 seconds and 5 time sit to stand test performed in 13 seconds.  She demonstrated a normal cadence of gait but decrease toe clearance.  She has U and LE strength deficits.  Pain will benefit from skilled PT intervention.  OBJECTIVE IMPAIRMENTS: Abnormal gait, decreased activity tolerance, and decreased endurance.   ACTIVITY LIMITATIONS: carrying, lifting, bending, and locomotion level  PARTICIPATION LIMITATIONS: meal prep, cleaning, laundry, community activity, and yard work  PERSONAL FACTORS: Time  since onset of injury/illness/exacerbation and 1 comorbidity: liver issues are also affecting patient's functional outcome.   REHAB POTENTIAL: Fair plus/Good minus  CLINICAL DECISION MAKING: Evolving/moderate complexity  EVALUATION COMPLEXITY: Low   GOALS:  SHORT TERM GOALS: Target date: 07/03/24  Ind with a HEP. Goal status: INITIAL   LONG TERM GOALS: Target date: 07/31/24  Improve 5 time sit to stand by 2-3 seconds.  Goal status: INITIAL  2.  Improve TUG by 2-3 seconds.    Goal status: INITIAL  3.  Walk walk in clinic with with a straight cane 500 feet with normal toe clearance.  Goal status: INITIAL PLAN:  PT FREQUENCY: 2x/week  PT DURATION: 6 weeks  PLANNED INTERVENTIONS: 97110-Therapeutic exercises, 97530- Therapeutic activity, [id number]- Neuromuscular re-education, 97535- Self Care, and 02859- Manual therapy  PLAN FOR NEXT SESSION: Nustep, UBE, gait and balance activities, U and LE strengthening.     Toma Erichsen, [place], PT 06/19/2024, 2:33 PM

## 2024-06-26 ENCOUNTER — Ambulatory Visit

## 2024-06-26 DIAGNOSIS — M6281 Muscle weakness (generalized): Secondary | ICD-10-CM

## 2024-06-26 DIAGNOSIS — R2689 Other abnormalities of gait and mobility: Secondary | ICD-10-CM

## 2024-06-26 NOTE — Therapy (Signed)
 OUTPATIENT PHYSICAL THERAPY LOWER EXTREMITY TREATMENT   Patient Name: Kim Harris MRN: [id number] DOB:06/15/1956, 68 y.o., female Today's Date: 06/26/2024  END OF SESSION:  PT End of Session - 06/26/24 1305     Visit Number 2    Number of Visits 12    Date for PT Re-Evaluation 07/31/24    PT Start Time 1300    PT Stop Time 1345    PT Time Calculation (min) 45 min    Activity Tolerance Patient tolerated treatment well    Behavior During Therapy WFL for tasks assessed/performed          Past Medical History:  Diagnosis Date   Anasarca    Anemia    Anxiety    Cirrhosis (HCC)    Depression    HCV (hepatitis C virus)    Hepatitis    c   Pneumonia    Septic prepatellar bursitis of left knee 05/18/2017   Past Surgical History:  Procedure Laterality Date   CHOLECYSTECTOMY     I & D EXTREMITY Left 05/18/2017   Procedure: IRRIGATION AND DEBRIDEMENT INFECTED LEFT KNEE WITH PRE-PATELLA BURSECTOMY;  Surgeon: Josefina Chew, MD;  Location: [facility] OR;  Service: Orthopedics;  Laterality: Left;   Patient Active Problem List   Diagnosis Date Noted   Septic prepatellar bursitis of left knee 05/18/2017   HEPATITIS C 02/07/2010   Hepatic cirrhosis (HCC) 02/07/2010   ALCOHOLIC CIRRHOSIS OF LIVER 02/04/2010   Disorder of liver 02/02/2010   REFERRING PROVIDER: RONAL Donna Magnuson MD  REFERRING DIAG: Debility, weakness.  THERAPY DIAG:  Muscle weakness (generalized)  Other abnormalities of gait and mobility  Rationale for Evaluation and Treatment: Rehabilitation  ONSET DATE: Ongoing.    SUBJECTIVE:   SUBJECTIVE STATEMENT: Pt denies any pain today.    PERTINENT HISTORY: Please see above.   PAIN:  Are you having pain? No  PRECAUTIONS: Fall.  Patient had a fall related to leaning on a unstable table and once when she felt exhausted.  Recommended she use a cane.    RED FLAGS: None   WEIGHT BEARING RESTRICTIONS: No  FALLS:  Has patient fallen in last 6 months? Yes. Number  of falls 2.  LIVING ENVIRONMENT: Lives in: House/apartment Has following equipment at home: None  PLOF: Independent with basic ADLs  PATIENT GOALS: Get stronger.    OBJECTIVE:  POSTURE: rounded shoulders, forward head, and flexed trunk    LOWER/UPPER EXTREMITY ROM:  WFL. LOWER/UPPER EXTREMITY MMT:  Bilateral deltoid strength is 4-/5, bilateral elbow flexion is solid 4/5, right grip is 18# and left is 14#.  Bilateral hip flexion strength is 4/5, bilateral knee extension is 4+/5.  Normal bilateral ankle strength.  FUNCTIONAL TESTS:  5 times sit to stand: 13 seconds Timed up and go (TUG): 18 seconds.  GAIT: Patient walks with a normal cadence but has decreased foot clearance.  Recommended she use a cane at all times for safety.  TREATMENT DATE:     06/26/24                                  EXERCISE LOG  Exercise Repetitions and Resistance Comments  Nustep Lvl 2 x 15 mins   UBE 6 mins 120 RPM (forward/backward)   LAQs 2# 2 sets of 10 reps   Seated Marches 2# 2 sets of 10 reps   Seated Hip Abduction Red x 3 mins   Seated Hip Adduction 3 mins   Seated Ham Curls Red x 20 reps    STS 5 reps SUE support; 5 reps no UE support    Blank cell = exercise not performed today   PATIENT EDUCATION:  Education details:  Person educated:  International aid/development worker:  Education comprehension:   HOME EXERCISE PROGRAM:   ASSESSMENT:  CLINICAL IMPRESSION: Pt arrives for today's treatment session denying any pain.  Pt able to tolerate warm up on Nustep and UBE today without pain, but does report fatigue.  Pt instructed in seated BLE exercises today to increase strength and function.  Pt requiring min cues for proper technique and posture.  Pt denies any pain at completion of today's treatment session.   OBJECTIVE IMPAIRMENTS: Abnormal gait, decreased activity  tolerance, and decreased endurance.   ACTIVITY LIMITATIONS: carrying, lifting, bending, and locomotion level  PARTICIPATION LIMITATIONS: meal prep, cleaning, laundry, community activity, and yard work  PERSONAL FACTORS: Time since onset of injury/illness/exacerbation and 1 comorbidity: liver issues are also affecting patient's functional outcome.   REHAB POTENTIAL: Fair plus/Good minus  CLINICAL DECISION MAKING: Evolving/moderate complexity  EVALUATION COMPLEXITY: Low   GOALS:  SHORT TERM GOALS: Target date: 07/03/24  Ind with a HEP. Goal status: INITIAL   LONG TERM GOALS: Target date: 07/31/24  Improve 5 time sit to stand by 2-3 seconds.  Goal status: INITIAL  2.  Improve TUG by 2-3 seconds.    Goal status: INITIAL  3.  Walk walk in clinic with with a straight cane 500 feet with normal toe clearance.  Goal status: INITIAL PLAN:  PT FREQUENCY: 2x/week  PT DURATION: 6 weeks  PLANNED INTERVENTIONS: 97110-Therapeutic exercises, 97530- Therapeutic activity, [id number]- Neuromuscular re-education, 97535- Self Care, and 02859- Manual therapy  PLAN FOR NEXT SESSION: Nustep, UBE, gait and balance activities, U and LE strengthening.     Delon DELENA Gosling, PTA 06/26/2024, 2:44 PM

## 2024-07-01 ENCOUNTER — Encounter: Payer: Self-pay | Admitting: Physical Therapy

## 2024-07-01 ENCOUNTER — Ambulatory Visit: Admitting: Physical Therapy

## 2024-07-01 DIAGNOSIS — R2689 Other abnormalities of gait and mobility: Secondary | ICD-10-CM

## 2024-07-01 DIAGNOSIS — M6281 Muscle weakness (generalized): Secondary | ICD-10-CM

## 2024-07-01 NOTE — Therapy (Signed)
 OUTPATIENT PHYSICAL THERAPY LOWER EXTREMITY TREATMENT   Patient Name: Kim Harris MRN: [id number] DOB:1955/11/26, 68 y.o., female Today's Date: 07/01/2024  END OF SESSION:  PT End of Session - 07/01/24 1058     Visit Number 3    Number of Visits 12    Date for Recertification  07/31/24    PT Start Time 1100    PT Stop Time 1140    PT Time Calculation (min) 40 min    Activity Tolerance Patient tolerated treatment well    Behavior During Therapy WFL for tasks assessed/performed           Past Medical History:  Diagnosis Date   Anasarca    Anemia    Anxiety    Cirrhosis (HCC)    Depression    HCV (hepatitis C virus)    Hepatitis    c   Pneumonia    Septic prepatellar bursitis of left knee 05/18/2017   Past Surgical History:  Procedure Laterality Date   CHOLECYSTECTOMY     I & D EXTREMITY Left 05/18/2017   Procedure: IRRIGATION AND DEBRIDEMENT INFECTED LEFT KNEE WITH PRE-PATELLA BURSECTOMY;  Surgeon: Josefina Chew, MD;  Location: [facility] OR;  Service: Orthopedics;  Laterality: Left;   Patient Active Problem List   Diagnosis Date Noted   Septic prepatellar bursitis of left knee 05/18/2017   HEPATITIS C 02/07/2010   Hepatic cirrhosis (HCC) 02/07/2010   ALCOHOLIC CIRRHOSIS OF LIVER 02/04/2010   Disorder of liver 02/02/2010   REFERRING PROVIDER: RONAL Donna Magnuson MD  REFERRING DIAG: Debility, weakness.  THERAPY DIAG:  Muscle weakness (generalized)  Other abnormalities of gait and mobility  Rationale for Evaluation and Treatment: Rehabilitation  ONSET DATE: Ongoing.    SUBJECTIVE:   SUBJECTIVE STATEMENT: Pt reports no new complaints.   PERTINENT HISTORY: Please see above.   PAIN:  Are you having pain? No  PRECAUTIONS: Fall.  Patient had a fall related to leaning on a unstable table and once when she felt exhausted.  Recommended she use a cane.    RED FLAGS: None   WEIGHT BEARING RESTRICTIONS: No  FALLS:  Has patient fallen in last 6 months? Yes.  Number of falls 2.  LIVING ENVIRONMENT: Lives in: House/apartment Has following equipment at home: None  PLOF: Independent with basic ADLs  PATIENT GOALS: Get stronger.    OBJECTIVE:  POSTURE: rounded shoulders, forward head, and flexed trunk    LOWER/UPPER EXTREMITY ROM: WFL.  LOWER/UPPER EXTREMITY MMT: Bilateral deltoid strength is 4-/5, bilateral elbow flexion is solid 4/5, right grip is 18# and left is 14#.  Bilateral hip flexion strength is 4/5, bilateral knee extension is 4+/5.  Normal bilateral ankle strength.  FUNCTIONAL TESTS:  5 times sit to stand: 13 seconds Timed up and go (TUG): 18 seconds.  GAIT: Patient walks with a normal cadence but has decreased foot clearance.  Recommended she use a cane at all times for safety.  TREATMENT DATE:   07/01/24  Therapeutic Exercise: Nustep using UE/LEs x 15 min, intermittently increasing/decreasing resistance from L2-L5, intermittently tasked to increase/decrease SPM, cues to keep from L lateral lean Seated march 2# 2x10 Seated LAQ 2# 2x10 Seated hamstring curl 2# 2x10 Seated hip abduction red TB 2x10 alternating challenging Seated R lateral trunk flexion to decrease L lateral lean x10 (cues to get R elbow to mat table) Seated palloff press with 2lb med ball x10 Seated diagonal chops 2lb med ball 2x10 cues to keep feet flat (especially R)   06/26/24                                  EXERCISE LOG  Exercise Repetitions and Resistance Comments  Nustep Lvl 2 x 15 mins   UBE 6 mins 120 RPM (forward/backward)   LAQs 2# 2 sets of 10 reps   Seated Marches 2# 2 sets of 10 reps   Seated Hip Abduction Red x 3 mins   Seated Hip Adduction 3 mins   Seated Ham Curls Red x 20 reps    STS 5 reps SUE support; 5 reps no UE support    Blank cell = exercise not performed today   PATIENT EDUCATION:  Education  details:  Person educated:  International aid/development worker:  Education comprehension:   HOME EXERCISE PROGRAM:   ASSESSMENT:  CLINICAL IMPRESSION: Pt reports no complaints. Pt requiring min cues for proper technique and posture. Demos L lateral lean with standing and seated activities -- added trunk/postural strengthening exercises in seated today to improve on this. Pt did report some R lateral hip pain with LAQ. Continued progressive LE strengthening. Highly challenged with seated single leg clamshell. Encouraged pt once again today to amb with cane as she very much likes to touch objects to maintain her balance and stability with walking. Consider gait training with [facility] for community safety.   OBJECTIVE IMPAIRMENTS: Abnormal gait, decreased activity tolerance, and decreased endurance.   ACTIVITY LIMITATIONS: carrying, lifting, bending, and locomotion level  PARTICIPATION LIMITATIONS: meal prep, cleaning, laundry, community activity, and yard work  PERSONAL FACTORS: Time since onset of injury/illness/exacerbation and 1 comorbidity: liver issues are also affecting patient's functional outcome.   REHAB POTENTIAL: Fair plus/Good minus  CLINICAL DECISION MAKING: Evolving/moderate complexity  EVALUATION COMPLEXITY: Low   GOALS:  SHORT TERM GOALS: Target date: 07/03/24  Ind with a HEP. Goal status: IN PROGRESS   LONG TERM GOALS: Target date: 07/31/24  Improve 5 time sit to stand by 2-3 seconds.  Goal status: INITIAL  2.  Improve TUG by 2-3 seconds.    Goal status: INITIAL  3.  Walk walk in clinic with with a straight cane 500 feet with normal toe clearance.  Goal status: INITIAL PLAN:  PT FREQUENCY: 2x/week  PT DURATION: 6 weeks  PLANNED INTERVENTIONS: 97110-Therapeutic exercises, 97530- Therapeutic activity, [id number]- Neuromuscular re-education, 97535- Self Care, and 02859- Manual therapy  PLAN FOR NEXT SESSION: Nustep, UBE, gait and balance activities, U and LE strengthening.      Feliciana Narayan April Ma L Starlyn Droge, PT, DPT 07/01/2024, 10:59 AM

## 2024-07-03 ENCOUNTER — Encounter

## 2024-07-08 ENCOUNTER — Encounter

## 2024-07-10 ENCOUNTER — Encounter

## 2024-07-17 ENCOUNTER — Encounter

## 2024-07-22 ENCOUNTER — Ambulatory Visit: Attending: Gastroenterology

## 2024-07-22 DIAGNOSIS — M6281 Muscle weakness (generalized): Secondary | ICD-10-CM | POA: Insufficient documentation

## 2024-07-22 DIAGNOSIS — R2689 Other abnormalities of gait and mobility: Secondary | ICD-10-CM | POA: Insufficient documentation

## 2024-07-22 NOTE — Therapy (Signed)
 OUTPATIENT PHYSICAL THERAPY LOWER EXTREMITY TREATMENT   Patient Name: Kim Harris MRN: [id number] DOB:08/17/1956, 68 y.o., female Today's Date: 07/22/2024  END OF SESSION:  PT End of Session - 07/22/24 1523     Visit Number 4    Number of Visits 12    Date for Recertification  07/31/24    Authorization Type [employer]    Authorization Time Period 12 visits approved 06/19/24 - 08/28/24    Authorization - Number of Visits 12    PT Start Time 1515    PT Stop Time 1600    PT Time Calculation (min) 45 min    Activity Tolerance Patient tolerated treatment well    Behavior During Therapy WFL for tasks assessed/performed           Past Medical History:  Diagnosis Date   Anasarca    Anemia    Anxiety    Cirrhosis (HCC)    Depression    HCV (hepatitis C virus)    Hepatitis    c   Pneumonia    Septic prepatellar bursitis of left knee 05/18/2017   Past Surgical History:  Procedure Laterality Date   CHOLECYSTECTOMY     I & D EXTREMITY Left 05/18/2017   Procedure: IRRIGATION AND DEBRIDEMENT INFECTED LEFT KNEE WITH PRE-PATELLA BURSECTOMY;  Surgeon: Josefina Chew, MD;  Location: [facility] OR;  Service: Orthopedics;  Laterality: Left;   Patient Active Problem List   Diagnosis Date Noted   Septic prepatellar bursitis of left knee 05/18/2017   HEPATITIS C 02/07/2010   Hepatic cirrhosis (HCC) 02/07/2010   ALCOHOLIC CIRRHOSIS OF LIVER 02/04/2010   Disorder of liver 02/02/2010   REFERRING PROVIDER: RONAL Donna Magnuson MD  REFERRING DIAG: Debility, weakness.  THERAPY DIAG:  Muscle weakness (generalized)  Other abnormalities of gait and mobility  Rationale for Evaluation and Treatment: Rehabilitation  ONSET DATE: Ongoing.    SUBJECTIVE:   SUBJECTIVE STATEMENT: Pt reports no new complaints.   PERTINENT HISTORY: Please see above.   PAIN:  Are you having pain? No  PRECAUTIONS: Fall.  Patient had a fall related to leaning on a unstable table and once when she felt  exhausted.  Recommended she use a cane.    RED FLAGS: None   WEIGHT BEARING RESTRICTIONS: No  FALLS:  Has patient fallen in last 6 months? Yes. Number of falls 2.  LIVING ENVIRONMENT: Lives in: House/apartment Has following equipment at home: None  PLOF: Independent with basic ADLs  PATIENT GOALS: Get stronger.    OBJECTIVE:  POSTURE: rounded shoulders, forward head, and flexed trunk    LOWER/UPPER EXTREMITY ROM: WFL.  LOWER/UPPER EXTREMITY MMT: Bilateral deltoid strength is 4-/5, bilateral elbow flexion is solid 4/5, right grip is 18# and left is 14#.  Bilateral hip flexion strength is 4/5, bilateral knee extension is 4+/5.  Normal bilateral ankle strength.  FUNCTIONAL TESTS:  5 times sit to stand: 13 seconds Timed up and go (TUG): 18 seconds.  GAIT: Patient walks with a normal cadence but has decreased foot clearance.  Recommended she use a cane at all times for safety.  TREATMENT DATE:    07/22/24   EXERCISE LOG  Exercise Repetitions and Resistance Comments  Nustep Lvl 3 x 15 mins   UBE 8 mins 120 RPM (forward/backward)   LAQs 2# 2 sets of 12 reps   Seated Marches 2# 2 sets of 12 reps   Seated Hip Abduction Red x 3.5 mins   Seated Hip Adduction 3.5 mins   Seated Ham Curls Red x 25 reps    STS     Blank cell = exercise not performed today    07/01/24  Therapeutic Exercise: Nustep using UE/LEs x 15 min, intermittently increasing/decreasing resistance from L2-L5, intermittently tasked to increase/decrease SPM, cues to keep from L lateral lean Seated march 2# 2x10 Seated LAQ 2# 2x10 Seated hamstring curl 2# 2x10 Seated hip abduction red TB 2x10 alternating challenging Seated R lateral trunk flexion to decrease L lateral lean x10 (cues to get R elbow to mat table) Seated palloff press with 2lb med ball x10 Seated diagonal chops 2lb  med ball 2x10 cues to keep feet flat (especially R)   06/26/24                                  EXERCISE LOG  Exercise Repetitions and Resistance Comments  Nustep Lvl 2 x 15 mins   UBE 6 mins 120 RPM (forward/backward)   LAQs 2# 2 sets of 10 reps   Seated Marches 2# 2 sets of 10 reps   Seated Hip Abduction Red x 3 mins   Seated Hip Adduction 3 mins   Seated Ham Curls Red x 20 reps    STS 5 reps SUE support; 5 reps no UE support    Blank cell = exercise not performed today   PATIENT EDUCATION:  Education details:  Person educated:  International aid/development worker:  Education comprehension:   HOME EXERCISE PROGRAM:   ASSESSMENT:  CLINICAL IMPRESSION: Pt arrives for today's treatment session denying any pain.  Pt able to tolerate increased time and reps with all previously performed exercises.  Pt requiring cues for SPM on Nustep and RPMs on UBE.  Pt denied any pain at completion of today's treatment session.   OBJECTIVE IMPAIRMENTS: Abnormal gait, decreased activity tolerance, and decreased endurance.   ACTIVITY LIMITATIONS: carrying, lifting, bending, and locomotion level  PARTICIPATION LIMITATIONS: meal prep, cleaning, laundry, community activity, and yard work  PERSONAL FACTORS: Time since onset of injury/illness/exacerbation and 1 comorbidity: liver issues are also affecting patient's functional outcome.   REHAB POTENTIAL: Fair plus/Good minus  CLINICAL DECISION MAKING: Evolving/moderate complexity  EVALUATION COMPLEXITY: Low   GOALS:  SHORT TERM GOALS: Target date: 07/03/24  Ind with a HEP. Goal status: IN PROGRESS   LONG TERM GOALS: Target date: 07/31/24  Improve 5 time sit to stand by 2-3 seconds.  Goal status: INITIAL  2.  Improve TUG by 2-3 seconds.    Goal status: INITIAL  3.  Walk walk in clinic with with a straight cane 500 feet with normal toe clearance.  Goal status: INITIAL PLAN:  PT FREQUENCY: 2x/week  PT DURATION: 6 weeks  PLANNED INTERVENTIONS:  97110-Therapeutic exercises, 97530- Therapeutic activity, [id number]- Neuromuscular re-education, 97535- Self Care, and 02859- Manual therapy  PLAN FOR NEXT SESSION: Nustep, UBE, gait and balance activities, U and LE strengthening.     Delon DELENA Gosling, PTA, DPT 07/22/2024, 5:35 PM

## 2024-07-24 ENCOUNTER — Ambulatory Visit

## 2024-07-24 DIAGNOSIS — M6281 Muscle weakness (generalized): Secondary | ICD-10-CM

## 2024-07-24 DIAGNOSIS — R2689 Other abnormalities of gait and mobility: Secondary | ICD-10-CM

## 2024-07-24 NOTE — Therapy (Signed)
 OUTPATIENT PHYSICAL THERAPY LOWER EXTREMITY TREATMENT   Patient Name: Kim Harris MRN: [id number] DOB:[birth year], 68 y.o., female Today's Date: 07/24/2024  END OF SESSION:  PT End of Session - 07/24/24 1105     Visit Number 5    Number of Visits 12    Date for Recertification  07/31/24    Authorization Type [employer] Medicare    Authorization Time Period 12 visits approved 06/19/24 - 08/28/24    Authorization - Number of Visits 12    PT Start Time 1100    PT Stop Time 1140    PT Time Calculation (min) 40 min    Activity Tolerance Patient tolerated treatment well    Behavior During Therapy WFL for tasks assessed/performed           Past Medical History:  Diagnosis Date   Anasarca    Anemia    Anxiety    Cirrhosis (HCC)    Depression    HCV (hepatitis C virus)    Hepatitis    c   Pneumonia    Septic prepatellar bursitis of left knee 05/18/2017   Past Surgical History:  Procedure Laterality Date   CHOLECYSTECTOMY     I & D EXTREMITY Left 05/18/2017   Procedure: IRRIGATION AND DEBRIDEMENT INFECTED LEFT KNEE WITH PRE-PATELLA BURSECTOMY;  Surgeon: Josefina Chew, MD;  Location: [facility] OR;  Service: Orthopedics;  Laterality: Left;   Patient Active Problem List   Diagnosis Date Noted   Septic prepatellar bursitis of left knee 05/18/2017   HEPATITIS C 02/07/2010   Hepatic cirrhosis (HCC) 02/07/2010   ALCOHOLIC CIRRHOSIS OF LIVER 02/04/2010   Disorder of liver 02/02/2010   REFERRING PROVIDER: RONAL Donna Magnuson MD  REFERRING DIAG: Debility, weakness.  THERAPY DIAG:  Muscle weakness (generalized)  Other abnormalities of gait and mobility  Rationale for Evaluation and Treatment: Rehabilitation  ONSET DATE: Ongoing.    SUBJECTIVE:   SUBJECTIVE STATEMENT: Pt reports no new complaints.   PERTINENT HISTORY: Please see above.   PAIN:  Are you having pain? No  PRECAUTIONS: Fall.  Patient had a fall related to leaning on a unstable table and once when she felt  exhausted.  Recommended she use a cane.    RED FLAGS: None   WEIGHT BEARING RESTRICTIONS: No  FALLS:  Has patient fallen in last 6 months? Yes. Number of falls 2.  LIVING ENVIRONMENT: Lives in: House/apartment Has following equipment at home: None  PLOF: Independent with basic ADLs  PATIENT GOALS: Get stronger.    OBJECTIVE:  POSTURE: rounded shoulders, forward head, and flexed trunk    LOWER/UPPER EXTREMITY ROM: WFL.  LOWER/UPPER EXTREMITY MMT: Bilateral deltoid strength is 4-/5, bilateral elbow flexion is solid 4/5, right grip is 18# and left is 14#.  Bilateral hip flexion strength is 4/5, bilateral knee extension is 4+/5.  Normal bilateral ankle strength.  FUNCTIONAL TESTS:  5 times sit to stand: 13 seconds Timed up and go (TUG): 18 seconds.  GAIT: Patient walks with a normal cadence but has decreased foot clearance.  Recommended she use a cane at all times for safety.  TREATMENT DATE:    07/24/24   EXERCISE LOG  Exercise Repetitions and Resistance Comments  Nustep Lvl 3 x 15 mins   UBE 10 mins 120 RPM (forward/backward)   LAQs 2# 2 sets of 15 reps   Seated Marches 2# 2 sets of 15 reps   Seated Hip Abduction Red x 3.5 mins   Seated Hip Adduction 3.5 mins   Seated Ham Curls Red x 25 reps    STS    Goal Assessment See Below    Blank cell = exercise not performed today    07/01/24  Therapeutic Exercise: Nustep using UE/LEs x 15 min, intermittently increasing/decreasing resistance from L2-L5, intermittently tasked to increase/decrease SPM, cues to keep from L lateral lean Seated march 2# 2x10 Seated LAQ 2# 2x10 Seated hamstring curl 2# 2x10 Seated hip abduction red TB 2x10 alternating challenging Seated R lateral trunk flexion to decrease L lateral lean x10 (cues to get R elbow to mat table) Seated palloff press with 2lb med ball  x10 Seated diagonal chops 2lb med ball 2x10 cues to keep feet flat (especially R)   06/26/24                                  EXERCISE LOG  Exercise Repetitions and Resistance Comments  Nustep Lvl 2 x 15 mins   UBE 6 mins 120 RPM (forward/backward)   LAQs 2# 2 sets of 10 reps   Seated Marches 2# 2 sets of 10 reps   Seated Hip Abduction Red x 3 mins   Seated Hip Adduction 3 mins   Seated Ham Curls Red x 20 reps    STS 5 reps SUE support; 5 reps no UE support    Blank cell = exercise not performed today   PATIENT EDUCATION:  Education details:  Person educated:  International aid/development worker:  Education comprehension:   HOME EXERCISE PROGRAM:   ASSESSMENT:  CLINICAL IMPRESSION: Pt arrives for today's treatment session denying any pain.  Pt able to performed TUG in 16.5 seconds and 5 STS test in 12.2 seconds, making good progress towards all of her goals at this time.  Pt able to tolerate increased reps or time with all previously performed exercises.  Pt denied any pain at completion of today's treatment session.  OBJECTIVE IMPAIRMENTS: Abnormal gait, decreased activity tolerance, and decreased endurance.   ACTIVITY LIMITATIONS: carrying, lifting, bending, and locomotion level  PARTICIPATION LIMITATIONS: meal prep, cleaning, laundry, community activity, and yard work  PERSONAL FACTORS: Time since onset of injury/illness/exacerbation and 1 comorbidity: liver issues are also affecting patient's functional outcome.   REHAB POTENTIAL: Fair plus/Good minus  CLINICAL DECISION MAKING: Evolving/moderate complexity  EVALUATION COMPLEXITY: Low   GOALS:  SHORT TERM GOALS: Target date: 07/03/24  Ind with a HEP. Goal status: MET   LONG TERM GOALS: Target date: 07/31/24  Improve 5 time sit to stand by 2-3 seconds.  10/15: 12.2 seconds (.8 second decrease) Goal status: IN PROGRESS  2.  Improve TUG by 2-3 seconds.   10/15: 16.5 seconds (1.5 seconds decrease) Goal status: IN  PROGRESS  3.  Walk walk in clinic with with a straight cane 500 feet with normal toe clearance.   10/15: pt ambulates without AD Goal status: IN PROGRESS PLAN:  PT FREQUENCY: 2x/week  PT DURATION: 6 weeks  PLANNED INTERVENTIONS: 97110-Therapeutic exercises, 97530- Therapeutic activity, [id number]- Neuromuscular re-education, 97535- Self Care, and 02859- Manual therapy  PLAN FOR  NEXT SESSION: Nustep, UBE, gait and balance activities, U and LE strengthening.     Delon DELENA Gosling, PTA, DPT 07/24/2024, 11:43 AM

## 2024-07-29 ENCOUNTER — Ambulatory Visit

## 2024-07-31 ENCOUNTER — Ambulatory Visit

## 2024-07-31 DIAGNOSIS — R2689 Other abnormalities of gait and mobility: Secondary | ICD-10-CM

## 2024-07-31 DIAGNOSIS — M6281 Muscle weakness (generalized): Secondary | ICD-10-CM | POA: Diagnosis not present

## 2024-07-31 NOTE — Therapy (Signed)
 OUTPATIENT PHYSICAL THERAPY LOWER EXTREMITY TREATMENT   Patient Name: Kim Harris MRN: [id number] DOB:[birth year], 68 y.o., female Today's Date: 07/31/2024  END OF SESSION:  PT End of Session - 07/31/24 1432     Visit Number 6    Number of Visits 12    Date for Recertification  07/31/24    Authorization Type [employer] Medicare    Authorization Time Period 12 visits approved 06/19/24 - 08/28/24    Authorization - Number of Visits 12    PT Start Time 1429    PT Stop Time 1516    PT Time Calculation (min) 47 min    Activity Tolerance Patient tolerated treatment well    Behavior During Therapy WFL for tasks assessed/performed           Past Medical History:  Diagnosis Date   Anasarca    Anemia    Anxiety    Cirrhosis (HCC)    Depression    HCV (hepatitis C virus)    Hepatitis    c   Pneumonia    Septic prepatellar bursitis of left knee 05/18/2017   Past Surgical History:  Procedure Laterality Date   CHOLECYSTECTOMY     I & D EXTREMITY Left 05/18/2017   Procedure: IRRIGATION AND DEBRIDEMENT INFECTED LEFT KNEE WITH PRE-PATELLA BURSECTOMY;  Surgeon: Josefina Chew, MD;  Location: [facility] OR;  Service: Orthopedics;  Laterality: Left;   Patient Active Problem List   Diagnosis Date Noted   Septic prepatellar bursitis of left knee 05/18/2017   HEPATITIS C 02/07/2010   Hepatic cirrhosis (HCC) 02/07/2010   ALCOHOLIC CIRRHOSIS OF LIVER 02/04/2010   Disorder of liver 02/02/2010   REFERRING PROVIDER: RONAL Donna Magnuson MD  REFERRING DIAG: Debility, weakness.  THERAPY DIAG:  Muscle weakness (generalized)  Other abnormalities of gait and mobility  Rationale for Evaluation and Treatment: Rehabilitation  ONSET DATE: Ongoing.    SUBJECTIVE:   SUBJECTIVE STATEMENT: Pt reports no new complaints.   PERTINENT HISTORY: Please see above.   PAIN:  Are you having pain? No  PRECAUTIONS: Fall.  Patient had a fall related to leaning on a unstable table and once when she felt  exhausted.  Recommended she use a cane.    RED FLAGS: None   WEIGHT BEARING RESTRICTIONS: No  FALLS:  Has patient fallen in last 6 months? Yes. Number of falls 2.  LIVING ENVIRONMENT: Lives in: House/apartment Has following equipment at home: None  PLOF: Independent with basic ADLs  PATIENT GOALS: Get stronger.    OBJECTIVE:  POSTURE: rounded shoulders, forward head, and flexed trunk    LOWER/UPPER EXTREMITY ROM: WFL.  LOWER/UPPER EXTREMITY MMT: Bilateral deltoid strength is 4-/5, bilateral elbow flexion is solid 4/5, right grip is 18# and left is 14#.  Bilateral hip flexion strength is 4/5, bilateral knee extension is 4+/5.  Normal bilateral ankle strength.  FUNCTIONAL TESTS:  5 times sit to stand: 13 seconds Timed up and go (TUG): 18 seconds.  GAIT: Patient walks with a normal cadence but has decreased foot clearance.  Recommended she use a cane at all times for safety.  TREATMENT DATE:    07/31/24   EXERCISE LOG  Exercise Repetitions and Resistance Comments  Nustep Lvl 3 x 15 mins   UBE 10 mins 120 RPM (forward/backward)   LAQs 3# x 20 reps bil   Seated Marches 3# x 20 reps bil   Seated Hip Abduction Green x 2.5 mins   Seated Hip Adduction 3.5 mins   Seated Ham Curls Green x 20 reps    STS    Goal Assessment See Below    Blank cell = exercise not performed today    07/01/24  Therapeutic Exercise: Nustep using UE/LEs x 15 min, intermittently increasing/decreasing resistance from L2-L5, intermittently tasked to increase/decrease SPM, cues to keep from L lateral lean Seated march 2# 2x10 Seated LAQ 2# 2x10 Seated hamstring curl 2# 2x10 Seated hip abduction red TB 2x10 alternating challenging Seated R lateral trunk flexion to decrease L lateral lean x10 (cues to get R elbow to mat table) Seated palloff press with 2lb med ball  x10 Seated diagonal chops 2lb med ball 2x10 cues to keep feet flat (especially R)   06/26/24                                  EXERCISE LOG  Exercise Repetitions and Resistance Comments  Nustep Lvl 2 x 15 mins   UBE 6 mins 120 RPM (forward/backward)   LAQs 2# 2 sets of 10 reps   Seated Marches 2# 2 sets of 10 reps   Seated Hip Abduction Red x 3 mins   Seated Hip Adduction 3 mins   Seated Ham Curls Red x 20 reps    STS 5 reps SUE support; 5 reps no UE support    Blank cell = exercise not performed today   PATIENT EDUCATION:  Education details:  Person educated:  International aid/development worker:  Education comprehension:   HOME EXERCISE PROGRAM:   ASSESSMENT:  CLINICAL IMPRESSION: Pt arrives for today's treatment session denying any pain, but reports being tired due to being at [facility] late Monday evening.  Pt able to tolerate increased weight and resistance with all seated exercises today.  Pt denied any pain at completion of today's treatment session, but does report increased fatigue.   OBJECTIVE IMPAIRMENTS: Abnormal gait, decreased activity tolerance, and decreased endurance.   ACTIVITY LIMITATIONS: carrying, lifting, bending, and locomotion level  PARTICIPATION LIMITATIONS: meal prep, cleaning, laundry, community activity, and yard work  PERSONAL FACTORS: Time since onset of injury/illness/exacerbation and 1 comorbidity: liver issues are also affecting patient's functional outcome.   REHAB POTENTIAL: Fair plus/Good minus  CLINICAL DECISION MAKING: Evolving/moderate complexity  EVALUATION COMPLEXITY: Low   GOALS:  SHORT TERM GOALS: Target date: 07/03/24  Ind with a HEP. Goal status: MET   LONG TERM GOALS: Target date: 07/31/24  Improve 5 time sit to stand by 2-3 seconds.  10/15: 12.2 seconds (.8 second decrease) Goal status: IN PROGRESS  2.  Improve TUG by 2-3 seconds.   10/15: 16.5 seconds (1.5 seconds decrease) Goal status: IN PROGRESS  3.  Walk walk in clinic with with  a straight cane 500 feet with normal toe clearance.   10/15: pt ambulates without AD Goal status: IN PROGRESS PLAN:  PT FREQUENCY: 2x/week  PT DURATION: 6 weeks  PLANNED INTERVENTIONS: 97110-Therapeutic exercises, 97530- Therapeutic activity, 97112- Neuromuscular re-education, 97535- Self Care, and 02859- Manual therapy  PLAN FOR NEXT SESSION: Nustep, UBE, gait and balance activities, U and LE  strengthening.     Delon DELENA Gosling, PTA, DPT 07/31/2024, 3:23 PM

## 2024-08-05 ENCOUNTER — Encounter: Payer: Self-pay | Admitting: Physical Therapy

## 2024-08-05 ENCOUNTER — Ambulatory Visit: Admitting: Physical Therapy

## 2024-08-05 DIAGNOSIS — R2689 Other abnormalities of gait and mobility: Secondary | ICD-10-CM

## 2024-08-05 DIAGNOSIS — M6281 Muscle weakness (generalized): Secondary | ICD-10-CM

## 2024-08-05 NOTE — Therapy (Signed)
 OUTPATIENT PHYSICAL THERAPY LOWER EXTREMITY TREATMENT AND DISCHARGE  PHYSICAL THERAPY DISCHARGE SUMMARY  Visits from Start of Care: 7  Current functional level related to goals / functional outcomes: See below   Remaining deficits: See below   Education / Equipment: See below   Patient agrees to discharge. Patient goals were partially met. Patient is being discharged due to maximized rehab potential.     Patient Name: Kim Harris MRN: [id number] DOB:02/03/1956, 68 y.o., female Today's Date: 08/05/2024  END OF SESSION:  PT End of Session - 08/05/24 1255     Visit Number 7    Number of Visits 12    Date for Recertification  07/31/24    Authorization Type [employer] Medicare    Authorization Time Period 12 visits approved 06/19/24 - 08/28/24    Authorization - Number of Visits 12    PT Start Time 1300    PT Stop Time 1340    PT Time Calculation (min) 40 min    Activity Tolerance Patient tolerated treatment well    Behavior During Therapy WFL for tasks assessed/performed            Past Medical History:  Diagnosis Date   Anasarca    Anemia    Anxiety    Cirrhosis (HCC)    Depression    HCV (hepatitis C virus)    Hepatitis    c   Pneumonia    Septic prepatellar bursitis of left knee 05/18/2017   Past Surgical History:  Procedure Laterality Date   CHOLECYSTECTOMY     I & D EXTREMITY Left 05/18/2017   Procedure: IRRIGATION AND DEBRIDEMENT INFECTED LEFT KNEE WITH PRE-PATELLA BURSECTOMY;  Surgeon: Josefina Chew, MD;  Location: [facility] OR;  Service: Orthopedics;  Laterality: Left;   Patient Active Problem List   Diagnosis Date Noted   Septic prepatellar bursitis of left knee 05/18/2017   HEPATITIS C 02/07/2010   Hepatic cirrhosis (HCC) 02/07/2010   ALCOHOLIC CIRRHOSIS OF LIVER 02/04/2010   Disorder of liver 02/02/2010   REFERRING PROVIDER: RONAL Donna Magnuson MD  REFERRING DIAG: Debility, weakness.  THERAPY DIAG:  Muscle weakness (generalized)  Other  abnormalities of gait and mobility  Rationale for Evaluation and Treatment: Rehabilitation  ONSET DATE: Ongoing.    SUBJECTIVE:   SUBJECTIVE STATEMENT: Pt states she got blood work done this morning at [facility]. Not sure when she will follow up on it.   PERTINENT HISTORY: Please see above.   PAIN:  Are you having pain? No  PRECAUTIONS: Fall.  Patient had a fall related to leaning on a unstable table and once when she felt exhausted.  Recommended she use a cane.    RED FLAGS: None   WEIGHT BEARING RESTRICTIONS: No  FALLS:  Has patient fallen in last 6 months? Yes. Number of falls 2.  LIVING ENVIRONMENT: Lives in: House/apartment Has following equipment at home: None  PLOF: Independent with basic ADLs  PATIENT GOALS: Get stronger.    OBJECTIVE:  POSTURE: rounded shoulders, forward head, and flexed trunk    LOWER/UPPER EXTREMITY ROM: WFL.  LOWER/UPPER EXTREMITY MMT: Bilateral deltoid strength is 4-/5, bilateral elbow flexion is solid 4/5, right grip is 18# and left is 14#.  Bilateral hip flexion strength is 4/5, bilateral knee extension is 4+/5.  Normal bilateral ankle strength.  FUNCTIONAL TESTS:  5 times sit to stand: 13 seconds Timed up and go (TUG): 18 seconds.  GAIT: Patient walks with a normal cadence but has decreased foot clearance.  Recommended she use  a cane at all times for safety.                                                                                                                                  TREATMENT DATE:    08/05/24   EXERCISE LOG  Exercise Repetitions and Resistance Comments  Nustep Lvl 5 x 15 mins   LAQs Green TB 2x 10 reps bil   Seated Marches Green TB 2x 10 reps bil   Seated Hip Abduction Green TB 2x 10   Seated Ham Curls Green TB 2x 10 reps    STS 3x5 no UE support   Side step Green TB x 3 laps by counter   Fwd and bwd monster walk Green TB x 3 laps by counter    Blank cell = exercise not performed today      PATIENT  EDUCATION:  Education details: Final HEP Person educated: Patient Education method: Verbal, demonstration, handout Education comprehension: Verbalized understanding, demonstrated to PT  HOME EXERCISE PROGRAM: Access Code: [email or web address] URL: [email or web address] Date: 08/05/2024 Prepared by: Zakyra Kukuk April Earnie Starring  Exercises - Seated March with Resistance  - 1 x daily - 7 x weekly - 2 sets - 10 reps - Seated Isometric Hip Abduction with Resistance  - 1 x daily - 7 x weekly - 2 sets - 10 reps - Seated Knee Extension with Resistance  - 1 x daily - 7 x weekly - 2 sets - 10 reps - Seated Hamstring Curls with Resistance  - 1 x daily - 7 x weekly - 2 sets - 10 reps - Sit to Stand Without Arm Support  - 1 x daily - 7 x weekly - 2 sets - 10 reps - Side Stepping with Resistance at Ankles  - 1 x daily - 7 x weekly - 2 sets - 10 reps - Backward Monster Walks  - 1 x daily - 7 x weekly - 2 sets - 10 reps - Forward Monster Walks  - 1 x daily - 7 x weekly - 2 sets - 10 reps  ASSESSMENT:  CLINICAL IMPRESSION: Treatment session focused on establishing HEP for pt d/c. Discussed with pt holding PT at this time until after pt gets her liver transplant. At this point, she seems to have plateaued. Added standing exercises that works on weight shift, strength and balance. Able to demo HEP with good understanding but required more cueing with fatigue. Pt agreeable to this plan.   OBJECTIVE IMPAIRMENTS: Abnormal gait, decreased activity tolerance, and decreased endurance.   ACTIVITY LIMITATIONS: carrying, lifting, bending, and locomotion level  PARTICIPATION LIMITATIONS: meal prep, cleaning, laundry, community activity, and yard work  PERSONAL FACTORS: Time since onset of injury/illness/exacerbation and 1 comorbidity: liver issues are also affecting patient's functional outcome.   REHAB POTENTIAL: Fair plus/Good minus  CLINICAL DECISION MAKING: Evolving/moderate complexity  EVALUATION  COMPLEXITY: Low   GOALS:  SHORT TERM GOALS: Target date: 07/03/24  Ind with a HEP. Goal status: MET   LONG TERM GOALS: Target date: 07/31/24  Improve 5 time sit to stand by 2-3 seconds.  10/15: 12.2 seconds (.8 second decrease) Goal status: IN PROGRESS  2.  Improve TUG by 2-3 seconds.   10/15: 16.5 seconds (1.5 seconds decrease) Goal status: IN PROGRESS  3.  Walk walk in clinic with with a straight cane 500 feet with normal toe clearance.   10/15: pt ambulates without AD Goal status: MET  PLAN:  PT FREQUENCY: 2x/week  PT DURATION: 6 weeks  PLANNED INTERVENTIONS: 97110-Therapeutic exercises, 97530- Therapeutic activity, 97112- Neuromuscular re-education, 97535- Self Care, and 02859- Manual therapy  PLAN FOR NEXT SESSION: Nustep, UBE, gait and balance activities, U and LE strengthening.     Italia Wolfert April Ma L Leiani Enright, PT, DPT 08/05/2024, 12:55 PM

## 2024-08-07 ENCOUNTER — Ambulatory Visit: Admitting: Physical Therapy

## 2024-08-12 ENCOUNTER — Telehealth: Payer: Self-pay | Admitting: Physical Therapy

## 2024-08-12 NOTE — Telephone Encounter (Signed)
 Attempted to call pt's sister, Darice in regards to pt's care. Went to [employer]. Left message for Darice to call PT back when able.  Aparna Vanderweele April Ma L Khamia Stambaugh, PT, DPT

## 2024-08-26 ENCOUNTER — Ambulatory Visit: Attending: Gastroenterology

## 2024-08-26 DIAGNOSIS — M6281 Muscle weakness (generalized): Secondary | ICD-10-CM | POA: Insufficient documentation

## 2024-08-26 DIAGNOSIS — R2689 Other abnormalities of gait and mobility: Secondary | ICD-10-CM | POA: Diagnosis present

## 2024-08-26 NOTE — Addendum Note (Signed)
 Addended by: JENNETTE CHRISTENE IZETTA EARNIE L on: 08/26/2024 03:32 PM   Modules accepted: Orders

## 2024-08-26 NOTE — Therapy (Addendum)
 OUTPATIENT PHYSICAL THERAPY RE-CERT  Patient Name: Kim Harris MRN: [id number] DOB:08/04/1956, 68 y.o., female Today's Date: 08/26/2024  END OF SESSION:  PT End of Session - 08/26/24 1305     Visit Number 8    Number of Visits 12    Date for Recertification  10/07/24    Authorization Type [employer] Medicare    Authorization Time Period 12 visits approved 06/19/24 - 08/28/24    Authorization - Number of Visits 12    PT Start Time 1303    PT Stop Time 1348    PT Time Calculation (min) 45 min    Activity Tolerance Patient tolerated treatment well    Behavior During Therapy WFL for tasks assessed/performed            Past Medical History:  Diagnosis Date   Anasarca    Anemia    Anxiety    Cirrhosis (HCC)    Depression    HCV (hepatitis C virus)    Hepatitis    c   Pneumonia    Septic prepatellar bursitis of left knee 05/18/2017   Past Surgical History:  Procedure Laterality Date   CHOLECYSTECTOMY     I & D EXTREMITY Left 05/18/2017   Procedure: IRRIGATION AND DEBRIDEMENT INFECTED LEFT KNEE WITH PRE-PATELLA BURSECTOMY;  Surgeon: Josefina Chew, MD;  Location: [facility] OR;  Service: Orthopedics;  Laterality: Left;   Patient Active Problem List   Diagnosis Date Noted   Septic prepatellar bursitis of left knee 05/18/2017   HEPATITIS C 02/07/2010   Hepatic cirrhosis (HCC) 02/07/2010   ALCOHOLIC CIRRHOSIS OF LIVER 02/04/2010   Disorder of liver 02/02/2010   REFERRING PROVIDER: RONAL Donna Magnuson MD  REFERRING DIAG: Debility, weakness.  THERAPY DIAG:  Other abnormalities of gait and mobility  Muscle weakness (generalized)  Rationale for Evaluation and Treatment: Rehabilitation  ONSET DATE: Ongoing.    SUBJECTIVE:   SUBJECTIVE STATEMENT: Pt states she got blood work done this morning at [facility]. Not sure when she will follow up on it.   PERTINENT HISTORY: Please see above.   PAIN:  Are you having pain? No  PRECAUTIONS: Fall.  Patient had a fall related to leaning on a  unstable table and once when she felt exhausted.  Recommended she use a cane.    RED FLAGS: None   WEIGHT BEARING RESTRICTIONS: No  FALLS:  Has patient fallen in last 6 months? Yes. Number of falls 2.  LIVING ENVIRONMENT: Lives in: House/apartment Has following equipment at home: None  PLOF: Independent with basic ADLs  PATIENT GOALS: Get stronger.    OBJECTIVE:  POSTURE: rounded shoulders, forward head, and flexed trunk    LOWER/UPPER EXTREMITY ROM: WFL.  LOWER/UPPER EXTREMITY MMT: Bilateral deltoid strength is 4-/5, bilateral elbow flexion is solid 4/5, right grip is 18# and left is 14#.  Bilateral hip flexion strength is 4/5, bilateral knee extension is 4+/5.  Normal bilateral ankle strength.  FUNCTIONAL TESTS:  5 times sit to stand: 13 seconds Timed up and go (TUG): 18 seconds.  GAIT: Patient walks with a normal cadence but has decreased foot clearance.  Recommended she use a cane at all times for safety.  TREATMENT DATE:    08/26/24   EXERCISE LOG  Exercise Repetitions and Resistance Comments  Nustep Lvl 3 x 15 mins   Stairs 4 stairs step-to-pattern 1 HA   Seated Target Tapping Cone x 15 reps bil   Tandem Stance ~1 second   LAQs    Seated Marches    Seated Hip Abduction    Seated Ham Curls    STS    Side step    Fwd and bwd monster walk    Goal Assessment See Below    Blank cell = exercise not performed today      PATIENT EDUCATION:  Education details: Final HEP Person educated: Patient Education method: Verbal, demonstration, handout Education comprehension: Verbalized understanding, demonstrated to PT  HOME EXERCISE PROGRAM: Access Code: [email or web address] URL: [email or web address] Date: 08/05/2024 Prepared by: Gellen April Earnie Starring  Exercises - Seated March with Resistance  - 1 x daily - 7 x weekly - 2  sets - 10 reps - Seated Isometric Hip Abduction with Resistance  - 1 x daily - 7 x weekly - 2 sets - 10 reps - Seated Knee Extension with Resistance  - 1 x daily - 7 x weekly - 2 sets - 10 reps - Seated Hamstring Curls with Resistance  - 1 x daily - 7 x weekly - 2 sets - 10 reps - Sit to Stand Without Arm Support  - 1 x daily - 7 x weekly - 2 sets - 10 reps - Side Stepping with Resistance at Ankles  - 1 x daily - 7 x weekly - 2 sets - 10 reps - Backward Monster Walks  - 1 x daily - 7 x weekly - 2 sets - 10 reps - Forward Monster Walks  - 1 x daily - 7 x weekly - 2 sets - 10 reps  ASSESSMENT:  CLINICAL IMPRESSION: Pt returns after discussions with pt's family about re continuing PT with an emphasis for more consistent attendance to see if she can get more progress with her PT. No overt change in pt's status since last visit. Re-cert and re-evaluation performed today.   Pt arrives for today's treatment session reporting minimal lower left quadrant pain, but feels good otherwise.  Pt states that she has been performing HEP at home with her sister.  Pt able to perform TUG in 14.77 seconds and 5 STS in 11 seconds, meeting both of her goals.  Pt reports increased difficulty with lifting her legs to getting into and out of car as well as performing stairs.  Pt unable to hold tandem stance longer than ~1 second and pt's sister noting concerns about her balance. Pt denied any pain at completion of today's treatment session.   PT updated pt's goals accordingly with hopes to continue to maximize her strength, balance and safety prior to her liver transplant.   OBJECTIVE IMPAIRMENTS: Abnormal gait, decreased activity tolerance, and decreased endurance.   ACTIVITY LIMITATIONS: carrying, lifting, bending, and locomotion level  PARTICIPATION LIMITATIONS: meal prep, cleaning, laundry, community activity, and yard work  PERSONAL FACTORS: Time since onset of injury/illness/exacerbation and 1 comorbidity: liver  issues are also affecting patient's functional outcome.   REHAB POTENTIAL: Fair plus/Good minus  CLINICAL DECISION MAKING: Evolving/moderate complexity  EVALUATION COMPLEXITY: Low   GOALS:  SHORT TERM GOALS: Target date: 07/03/24  Ind with a HEP. Goal status: MET   LONG TERM GOALS: Target date: 10/07/2024   Improve 5 time sit to stand by 2-3 seconds.  10/15: 12.2 seconds (.8  second decrease); 11/17: 11 seconds (2 second decrease)  Goal status: MET  2.  Improve TUG by 2-3 seconds.   10/15: 16.5 seconds (1.5 seconds decrease); 11/17: 14.77 seconds (3.23 second decrease) Goal status: MET  3.  Walk walk in clinic with with a straight cane 500 feet with normal toe clearance.   10/15: pt ambulates without AD Goal status: MET  4.  Will be able to ascend/descend 5 stairs with a reciprocal pattern and 1 hand rail for safety to demo improving hip strength.   11/17: step to pattern with 1 hand rail (L LE leading) Goal status: NEW  5.  Will be able to maintain tandem stance for at least 20 sec to demo improved balance.   11/17: <1 sec R & L Goal status: NEW  PLAN:  PT FREQUENCY: 2x/week  PT DURATION: 6 weeks  PLANNED INTERVENTIONS: 97110-Therapeutic exercises, 97530- Therapeutic activity, 97112- Neuromuscular re-education, 97535- Self Care, and 02859- Manual therapy  PLAN FOR NEXT SESSION: Nustep, UBE, gait and balance activities, U and LE strengthening.     Delon DELENA Gosling, PTA 08/26/2024, 2:02 PM   Gellen April Ma L Nonato, PT, DPT 08/26/2024, 3:30 PM

## 2024-08-28 ENCOUNTER — Encounter: Payer: Self-pay | Admitting: Physical Therapy

## 2024-08-28 ENCOUNTER — Ambulatory Visit: Admitting: Physical Therapy

## 2024-08-28 DIAGNOSIS — M6281 Muscle weakness (generalized): Secondary | ICD-10-CM

## 2024-08-28 DIAGNOSIS — R2689 Other abnormalities of gait and mobility: Secondary | ICD-10-CM

## 2024-08-28 NOTE — Therapy (Signed)
 OUTPATIENT PHYSICAL THERAPY TREATMENT  Patient Name: Kim Harris MRN: [id number] DOB:03-Nov-1955, 68 y.o., female Today's Date: 08/28/2024  END OF SESSION:  PT End of Session - 08/28/24 1351     Visit Number 9    Number of Visits 12    Date for Recertification  10/07/24    Authorization Type [employer] Medicare    Authorization Time Period 12 visits approved 06/19/24 - 08/28/24    Authorization - Number of Visits 12    PT Start Time 1345    PT Stop Time 1425    PT Time Calculation (min) 40 min    Activity Tolerance Patient tolerated treatment well    Behavior During Therapy WFL for tasks assessed/performed           Past Medical History:  Diagnosis Date   Anasarca    Anemia    Anxiety    Cirrhosis (HCC)    Depression    HCV (hepatitis C virus)    Hepatitis    c   Pneumonia    Septic prepatellar bursitis of left knee 05/18/2017   Past Surgical History:  Procedure Laterality Date   CHOLECYSTECTOMY     I & D EXTREMITY Left 05/18/2017   Procedure: IRRIGATION AND DEBRIDEMENT INFECTED LEFT KNEE WITH PRE-PATELLA BURSECTOMY;  Surgeon: Josefina Chew, MD;  Location: [facility] OR;  Service: Orthopedics;  Laterality: Left;   Patient Active Problem List   Diagnosis Date Noted   Septic prepatellar bursitis of left knee 05/18/2017   HEPATITIS C 02/07/2010   Hepatic cirrhosis (HCC) 02/07/2010   ALCOHOLIC CIRRHOSIS OF LIVER 02/04/2010   Disorder of liver 02/02/2010   REFERRING PROVIDER: RONAL Donna Magnuson MD  REFERRING DIAG: Debility, weakness.  THERAPY DIAG:  Other abnormalities of gait and mobility  Muscle weakness (generalized)  Rationale for Evaluation and Treatment: Rehabilitation  ONSET DATE: Ongoing.    SUBJECTIVE:   SUBJECTIVE STATEMENT: Pt reports no new complaints. Has continued to do her HEP with family assist. No falls.   PERTINENT HISTORY: Please see above.   PAIN:  Are you having pain? No  PRECAUTIONS: Fall.  Patient had a fall related to leaning on a  unstable table and once when she felt exhausted.  Recommended she use a cane.    RED FLAGS: None   WEIGHT BEARING RESTRICTIONS: No  FALLS:  Has patient fallen in last 6 months? Yes. Number of falls 2.  LIVING ENVIRONMENT: Lives in: House/apartment Has following equipment at home: None  PLOF: Independent with basic ADLs  PATIENT GOALS: Get stronger.    OBJECTIVE:  POSTURE: rounded shoulders, forward head, and flexed trunk    LOWER/UPPER EXTREMITY ROM: WFL.  LOWER/UPPER EXTREMITY MMT: Bilateral deltoid strength is 4-/5, bilateral elbow flexion is solid 4/5, right grip is 18# and left is 14#.  Bilateral hip flexion strength is 4/5, bilateral knee extension is 4+/5.  Normal bilateral ankle strength.  FUNCTIONAL TESTS:  5 times sit to stand: 13 seconds Timed up and go (TUG): 18 seconds.  GAIT: Patient walks with a normal cadence but has decreased foot clearance.  Recommended she use a cane at all times for safety.  TREATMENT DATE:   08/28/24   EXERCISE LOG  Exercise Repetitions and Resistance Comments  Nustep Lvl 2 x 15 mins maintaining >50 SPM Cues and encouragement to maintain SPM  Seated marching side step over cone 3# 2x10   Seated LAQ 3# X 2 min   Sit<>stand 4# 2X30 sec   Side step up and over 4 box x15 Required increased cueing -- especially with fatigue  One foot on 4 box in partial tandem 2x 30 sec                        Blank cell = exercise not performed today    08/26/24   EXERCISE LOG  Exercise Repetitions and Resistance Comments  Nustep Lvl 3 x 15 mins   Stairs 4 stairs step-to-pattern 1 HA   Seated Target Tapping Cone x 15 reps bil   Tandem Stance ~1 second   LAQs    Seated Marches    Seated Hip Abduction    Seated Ham Curls    STS    Side step    Fwd and bwd monster walk    Goal Assessment See Below    Blank  cell = exercise not performed today      PATIENT EDUCATION:  Education details: HEP Person educated: Patient Education method: Industrial/product Designer, demonstration, handout Education comprehension: Verbalized understanding, demonstrated to PT  HOME EXERCISE PROGRAM: Access Code: [email or web address] URL: [email or web address] Date: 08/05/2024 Prepared by: Charisse Wendell April Earnie Starring  Exercises - Seated March with Resistance  - 1 x daily - 7 x weekly - 2 sets - 10 reps - Seated Isometric Hip Abduction with Resistance  - 1 x daily - 7 x weekly - 2 sets - 10 reps - Seated Knee Extension with Resistance  - 1 x daily - 7 x weekly - 2 sets - 10 reps - Seated Hamstring Curls with Resistance  - 1 x daily - 7 x weekly - 2 sets - 10 reps - Sit to Stand Without Arm Support  - 1 x daily - 7 x weekly - 2 sets - 10 reps - Side Stepping with Resistance at Ankles  - 1 x daily - 7 x weekly - 2 sets - 10 reps - Backward Monster Walks  - 1 x daily - 7 x weekly - 2 sets - 10 reps - Forward Monster Walks  - 1 x daily - 7 x weekly - 2 sets - 10 reps  ASSESSMENT:  CLINICAL IMPRESSION: Initiated exercises to work towards new goals. Challenged with side step up and overs on 4 box. Pt fatigued and SHOB.   OBJECTIVE IMPAIRMENTS: Abnormal gait, decreased activity tolerance, and decreased endurance.   ACTIVITY LIMITATIONS: carrying, lifting, bending, and locomotion level  PARTICIPATION LIMITATIONS: meal prep, cleaning, laundry, community activity, and yard work  PERSONAL FACTORS: Time since onset of injury/illness/exacerbation and 1 comorbidity: liver issues are also affecting patient's functional outcome.   REHAB POTENTIAL: Fair plus/Good minus  CLINICAL DECISION MAKING: Evolving/moderate complexity  EVALUATION COMPLEXITY: Low   GOALS:  SHORT TERM GOALS: Target date: 07/03/24  Ind with a HEP. Goal status: MET   LONG TERM GOALS: Target date: 10/07/2024   Improve 5 time sit to stand by 2-3 seconds.   10/15: 12.2 seconds (.8 second decrease); 11/17: 11 seconds (2 second decrease)  Goal status: MET  2.  Improve TUG by 2-3 seconds.   10/15: 16.5 seconds (1.5 seconds decrease); 11/17: 14.77 seconds (3.23 second decrease) Goal status: MET  3.  Walk walk in clinic with with a straight cane 500 feet with normal toe clearance.   10/15: pt ambulates without AD Goal status: MET  4.  Will be able to ascend/descend 5 stairs with a reciprocal pattern and 1 hand rail for safety to demo improving hip strength.   11/17: step to pattern with 1 hand rail (L LE leading) Goal status: NEW  5.  Will be able to maintain tandem stance for at least 20 sec to demo improved balance.   11/17: <1 sec R & L Goal status: NEW  PLAN:  PT FREQUENCY: 2x/week  PT DURATION: 6 weeks  PLANNED INTERVENTIONS: 97110-Therapeutic exercises, 97530- Therapeutic activity, 97112- Neuromuscular re-education, 97535- Self Care, and 02859- Manual therapy  PLAN FOR NEXT SESSION: Nustep, UBE, gait and balance activities, U and LE strengthening.     Rainier Feuerborn April Ma L Warrick Llera, PT 08/28/2024, 1:51 PM

## 2024-09-02 ENCOUNTER — Encounter: Payer: Self-pay | Admitting: Physical Therapy

## 2024-09-02 ENCOUNTER — Ambulatory Visit: Admitting: Physical Therapy

## 2024-09-02 DIAGNOSIS — M6281 Muscle weakness (generalized): Secondary | ICD-10-CM

## 2024-09-02 DIAGNOSIS — R2689 Other abnormalities of gait and mobility: Secondary | ICD-10-CM | POA: Diagnosis not present

## 2024-09-02 NOTE — Therapy (Signed)
 OUTPATIENT PHYSICAL THERAPY TREATMENT  Patient Name: Kim Harris MRN: [id number] DOB:[birth year], 68 y.o., female Today's Date: 09/02/2024  END OF SESSION:  PT End of Session - 09/02/24 1017     Visit Number 10    Number of Visits 12    Date for Recertification  10/07/24    Authorization Type [employer] Medicare    Authorization Time Period 12 visits approved 06/19/24 - 08/28/24    Authorization - Number of Visits 12    PT Start Time 1015    PT Stop Time 1055    PT Time Calculation (min) 40 min    Activity Tolerance Patient tolerated treatment well    Behavior During Therapy WFL for tasks assessed/performed           Past Medical History:  Diagnosis Date   Anasarca    Anemia    Anxiety    Cirrhosis (HCC)    Depression    HCV (hepatitis C virus)    Hepatitis    c   Pneumonia    Septic prepatellar bursitis of left knee 05/18/2017   Past Surgical History:  Procedure Laterality Date   CHOLECYSTECTOMY     I & D EXTREMITY Left 05/18/2017   Procedure: IRRIGATION AND DEBRIDEMENT INFECTED LEFT KNEE WITH PRE-PATELLA BURSECTOMY;  Surgeon: Josefina Chew, MD;  Location: [facility] OR;  Service: Orthopedics;  Laterality: Left;   Patient Active Problem List   Diagnosis Date Noted   Septic prepatellar bursitis of left knee 05/18/2017   HEPATITIS C 02/07/2010   Hepatic cirrhosis (HCC) 02/07/2010   ALCOHOLIC CIRRHOSIS OF LIVER 02/04/2010   Disorder of liver 02/02/2010   REFERRING PROVIDER: RONAL Donna Magnuson MD  REFERRING DIAG: Debility, weakness.  THERAPY DIAG:  Other abnormalities of gait and mobility  Muscle weakness (generalized)  Rationale for Evaluation and Treatment: Rehabilitation  ONSET DATE: Ongoing.    SUBJECTIVE:   SUBJECTIVE STATEMENT: Pt reports nothing new or different. No falls.  PERTINENT HISTORY: Please see above.   PAIN:  Are you having pain? No  PRECAUTIONS: Fall.  Patient had a fall related to leaning on a unstable table and once when she felt  exhausted.  Recommended she use a cane.    RED FLAGS: None   WEIGHT BEARING RESTRICTIONS: No  FALLS:  Has patient fallen in last 6 months? Yes. Number of falls 2.  LIVING ENVIRONMENT: Lives in: House/apartment Has following equipment at home: None  PLOF: Independent with basic ADLs  PATIENT GOALS: Get stronger.    OBJECTIVE:  POSTURE: rounded shoulders, forward head, and flexed trunk    LOWER/UPPER EXTREMITY ROM: WFL.  LOWER/UPPER EXTREMITY MMT: Bilateral deltoid strength is 4-/5, bilateral elbow flexion is solid 4/5, right grip is 18# and left is 14#.  Bilateral hip flexion strength is 4/5, bilateral knee extension is 4+/5.  Normal bilateral ankle strength.  FUNCTIONAL TESTS:  5 times sit to stand: 13 seconds Timed up and go (TUG): 18 seconds.  GAIT: Patient walks with a normal cadence but has decreased foot clearance.  Recommended she use a cane at all times for safety.  TREATMENT DATE:   09/02/24   EXERCISE LOG  Exercise Repetitions and Resistance Comments  Nustep Lvl 3 x 15 mins maintaining >50 SPM Cues and encouragement to maintain SPM  Seated marching side step over pool noodles 4# 2x10   Seated LAQ 4# X 2 min   Standing hamstring curl 4# To fatigue   Side step up and over 4 box To fatigue Required increased cueing -- especially with fatigue  Heel/toe raise 2x10                        Blank cell = exercise not performed today    08/28/24   EXERCISE LOG  Exercise Repetitions and Resistance Comments  Nustep Lvl 2 x 15 mins maintaining >50 SPM Cues and encouragement to maintain SPM  Seated marching side step over cone 3# 2x10   Seated LAQ 3# X 2 min   Sit<>stand 4# 2X30 sec   Side step up and over 4 box x15 Required increased cueing -- especially with fatigue  One foot on 4 box in partial tandem 2x 30 sec                         Blank cell = exercise not performed today    08/26/24   EXERCISE LOG  Exercise Repetitions and Resistance Comments  Nustep Lvl 3 x 15 mins   Stairs 4 stairs step-to-pattern 1 HA   Seated Target Tapping Cone x 15 reps bil   Tandem Stance ~1 second   LAQs    Seated Marches    Seated Hip Abduction    Seated Ham Curls    STS    Side step    Fwd and bwd monster walk    Goal Assessment See Below    Blank cell = exercise not performed today      PATIENT EDUCATION:  Education details: HEP Person educated: Patient Education method: Verbal, demonstration, handout Education comprehension: Verbalized understanding, demonstrated to PT  HOME EXERCISE PROGRAM: Access Code: [email or web address] URL: [email or web address] Date: 08/05/2024 Prepared by: Laramie Gelles April Earnie Starring  Exercises - Seated March with Resistance  - 1 x daily - 7 x weekly - 2 sets - 10 reps - Seated Isometric Hip Abduction with Resistance  - 1 x daily - 7 x weekly - 2 sets - 10 reps - Seated Knee Extension with Resistance  - 1 x daily - 7 x weekly - 2 sets - 10 reps - Seated Hamstring Curls with Resistance  - 1 x daily - 7 x weekly - 2 sets - 10 reps - Sit to Stand Without Arm Support  - 1 x daily - 7 x weekly - 2 sets - 10 reps - Side Stepping with Resistance at Ankles  - 1 x daily - 7 x weekly - 2 sets - 10 reps - Backward Monster Walks  - 1 x daily - 7 x weekly - 2 sets - 10 reps - Forward Monster Walks  - 1 x daily - 7 x weekly - 2 sets - 10 reps  ASSESSMENT:  CLINICAL IMPRESSION:  Able to progress pt to 4# today. Continued to work on hip strengthening and stepping up. Requires constant verbal and tactile cueing to maintain correct form. Limited by fatigue.   OBJECTIVE IMPAIRMENTS: Abnormal gait, decreased activity tolerance, and decreased endurance.   ACTIVITY LIMITATIONS: carrying, lifting, bending, and locomotion level  PARTICIPATION LIMITATIONS: meal prep, cleaning, laundry, community  activity, and  yard work  PERSONAL FACTORS: Time since onset of injury/illness/exacerbation and 1 comorbidity: liver issues are also affecting patient's functional outcome.   REHAB POTENTIAL: Fair plus/Good minus  CLINICAL DECISION MAKING: Evolving/moderate complexity  EVALUATION COMPLEXITY: Low   GOALS:  SHORT TERM GOALS: Target date: 07/03/24  Ind with a HEP. Goal status: MET   LONG TERM GOALS: Target date: 10/07/2024   Improve 5 time sit to stand by 2-3 seconds.  10/15: 12.2 seconds (.8 second decrease); 11/17: 11 seconds (2 second decrease)  Goal status: MET  2.  Improve TUG by 2-3 seconds.   10/15: 16.5 seconds (1.5 seconds decrease); 11/17: 14.77 seconds (3.23 second decrease) Goal status: MET  3.  Walk walk in clinic with with a straight cane 500 feet with normal toe clearance.   10/15: pt ambulates without AD Goal status: MET  4.  Will be able to ascend/descend 5 stairs with a reciprocal pattern and 1 hand rail for safety to demo improving hip strength.   11/17: step to pattern with 1 hand rail (L LE leading) Goal status: NEW  5.  Will be able to maintain tandem stance for at least 20 sec to demo improved balance.   11/17: <1 sec R & L Goal status: NEW  PLAN:  PT FREQUENCY: 2x/week  PT DURATION: 6 weeks  PLANNED INTERVENTIONS: 97110-Therapeutic exercises, 97530- Therapeutic activity, 97112- Neuromuscular re-education, 97535- Self Care, and 02859- Manual therapy  PLAN FOR NEXT SESSION: Nustep, UBE, gait and balance activities, U and LE strengthening.     Shafer Swamy April Ma L Inge Waldroup, PT, DPT 09/02/2024, 10:31 AM

## 2024-09-04 ENCOUNTER — Ambulatory Visit: Admitting: Physical Therapy

## 2024-09-04 ENCOUNTER — Encounter: Payer: Self-pay | Admitting: Physical Therapy

## 2024-09-04 DIAGNOSIS — R2689 Other abnormalities of gait and mobility: Secondary | ICD-10-CM | POA: Diagnosis not present

## 2024-09-04 DIAGNOSIS — M6281 Muscle weakness (generalized): Secondary | ICD-10-CM

## 2024-09-04 NOTE — Therapy (Signed)
 OUTPATIENT PHYSICAL THERAPY TREATMENT  Patient Name: Kim Harris MRN: [id number] DOB:[birth year], 68 y.o., female Today's Date: 09/04/2024  END OF SESSION:  PT End of Session - 09/04/24 1314     Visit Number 11    Number of Visits 12    Date for Recertification  10/07/24    Authorization Type [employer] Medicare    Authorization Time Period 12 visits approved 06/19/24 - 08/28/24    Authorization - Number of Visits 12    PT Start Time 1300    PT Stop Time 1340    PT Time Calculation (min) 40 min    Activity Tolerance Patient tolerated treatment well    Behavior During Therapy WFL for tasks assessed/performed           Past Medical History:  Diagnosis Date   Anasarca    Anemia    Anxiety    Cirrhosis (HCC)    Depression    HCV (hepatitis C virus)    Hepatitis    c   Pneumonia    Septic prepatellar bursitis of left knee 05/18/2017   Past Surgical History:  Procedure Laterality Date   CHOLECYSTECTOMY     I & D EXTREMITY Left 05/18/2017   Procedure: IRRIGATION AND DEBRIDEMENT INFECTED LEFT KNEE WITH PRE-PATELLA BURSECTOMY;  Surgeon: Josefina Chew, MD;  Location: [facility] OR;  Service: Orthopedics;  Laterality: Left;   Patient Active Problem List   Diagnosis Date Noted   Septic prepatellar bursitis of left knee 05/18/2017   HEPATITIS C 02/07/2010   Hepatic cirrhosis (HCC) 02/07/2010   ALCOHOLIC CIRRHOSIS OF LIVER 02/04/2010   Disorder of liver 02/02/2010   REFERRING PROVIDER: RONAL Donna Magnuson MD  REFERRING DIAG: Debility, weakness.  THERAPY DIAG:  Other abnormalities of gait and mobility  Muscle weakness (generalized)  Rationale for Evaluation and Treatment: Rehabilitation  ONSET DATE: Ongoing.    SUBJECTIVE:   SUBJECTIVE STATEMENT: Pt endorses no falls. Has not had as much of a chance to do her exercises with her sister. States she didn't sleep well last night.   PERTINENT HISTORY: Please see above.    PAIN:  Are you having pain? No  PRECAUTIONS: Fall.   Patient had a fall related to leaning on a unstable table and once when she felt exhausted.  Recommended she use a cane.    RED FLAGS: None   WEIGHT BEARING RESTRICTIONS: No  FALLS:  Has patient fallen in last 6 months? Yes. Number of falls 2.  LIVING ENVIRONMENT: Lives in: House/apartment Has following equipment at home: None  PLOF: Independent with basic ADLs  PATIENT GOALS: Get stronger.    OBJECTIVE:  POSTURE: rounded shoulders, forward head, and flexed trunk    LOWER/UPPER EXTREMITY ROM: WFL.  LOWER/UPPER EXTREMITY MMT: Bilateral deltoid strength is 4-/5, bilateral elbow flexion is solid 4/5, right grip is 18# and left is 14#.  Bilateral hip flexion strength is 4/5, bilateral knee extension is 4+/5.  Normal bilateral ankle strength.  FUNCTIONAL TESTS:  5 times sit to stand: 13 seconds Timed up and go (TUG): 18 seconds.  GAIT: Patient walks with a normal cadence but has decreased foot clearance.  Recommended she use a cane at all times for safety.  TREATMENT DATE:   09/02/24   EXERCISE LOG  Exercise Repetitions and Resistance Comments  Nustep Lvl 6 x 15 mins maintaining >50 SPM Cues and encouragement to maintain SPM  Seated piriformis stretch X30   Standing marching 4# 2x30   Standing hip abduction 4# 2x30 SpO2 dropped to 90% but recovered with rest  Standing hip ext 4# 2x30 SpO2 drop to 92%, HR up to 100 BPM; recovers with rest  Sitting LAQ 4# 2x30 each   Tandem stance 2x30                    Blank cell = exercise not performed today    08/28/24   EXERCISE LOG  Exercise Repetitions and Resistance Comments  Nustep Lvl 2 x 15 mins maintaining >50 SPM Cues and encouragement to maintain SPM  Seated marching side step over cone 3# 2x10   Seated LAQ 3# X 2 min   Sit<>stand 4# 2X30 sec   Side step up and over 4 box x15  Required increased cueing -- especially with fatigue  One foot on 4 box in partial tandem 2x 30 sec                        Blank cell = exercise not performed today    08/26/24   EXERCISE LOG  Exercise Repetitions and Resistance Comments  Nustep Lvl 3 x 15 mins   Stairs 4 stairs step-to-pattern 1 HA   Seated Target Tapping Cone x 15 reps bil   Tandem Stance ~1 second   LAQs    Seated Marches    Seated Hip Abduction    Seated Ham Curls    STS    Side step    Fwd and bwd monster walk    Goal Assessment See Below    Blank cell = exercise not performed today      PATIENT EDUCATION:  Education details: HEP Person educated: Patient Education method: Industrial/product Designer, demonstration, handout Education comprehension: Verbalized understanding, demonstrated to PT  HOME EXERCISE PROGRAM: Access Code: [email or web address] URL: [email or web address] Date: 08/05/2024 Prepared by: Chantelle Verdi April Earnie Starring  Exercises - Seated March with Resistance  - 1 x daily - 7 x weekly - 2 sets - 10 reps - Seated Isometric Hip Abduction with Resistance  - 1 x daily - 7 x weekly - 2 sets - 10 reps - Seated Knee Extension with Resistance  - 1 x daily - 7 x weekly - 2 sets - 10 reps - Seated Hamstring Curls with Resistance  - 1 x daily - 7 x weekly - 2 sets - 10 reps - Sit to Stand Without Arm Support  - 1 x daily - 7 x weekly - 2 sets - 10 reps - Side Stepping with Resistance at Ankles  - 1 x daily - 7 x weekly - 2 sets - 10 reps - Backward Monster Walks  - 1 x daily - 7 x weekly - 2 sets - 10 reps - Forward Monster Walks  - 1 x daily - 7 x weekly - 2 sets - 10 reps  ASSESSMENT:  CLINICAL IMPRESSION:  Performed more exercises in standing today. Appears more SHOB today. Limited by fatigue. Monitored pt's vitals and spO2 dropped as low as 90% and HR up to 100 BPM. Continued balance exercises.   OBJECTIVE IMPAIRMENTS: Abnormal gait, decreased activity tolerance, and decreased endurance.   ACTIVITY  LIMITATIONS: carrying, lifting, bending, and locomotion level  PARTICIPATION LIMITATIONS: meal  prep, cleaning, laundry, community activity, and yard work  PERSONAL FACTORS: Time since onset of injury/illness/exacerbation and 1 comorbidity: liver issues are also affecting patient's functional outcome.   REHAB POTENTIAL: Fair plus/Good minus  CLINICAL DECISION MAKING: Evolving/moderate complexity  EVALUATION COMPLEXITY: Low   GOALS:  SHORT TERM GOALS: Target date: 07/03/24  Ind with a HEP. Goal status: MET   LONG TERM GOALS: Target date: 10/07/2024   Improve 5 time sit to stand by 2-3 seconds.  10/15: 12.2 seconds (.8 second decrease); 11/17: 11 seconds (2 second decrease)  Goal status: MET  2.  Improve TUG by 2-3 seconds.   10/15: 16.5 seconds (1.5 seconds decrease); 11/17: 14.77 seconds (3.23 second decrease) Goal status: MET  3.  Walk walk in clinic with with a straight cane 500 feet with normal toe clearance.   10/15: pt ambulates without AD Goal status: MET  4.  Will be able to ascend/descend 5 stairs with a reciprocal pattern and 1 hand rail for safety to demo improving hip strength.   11/17: step to pattern with 1 hand rail (L LE leading) Goal status: NEW  5.  Will be able to maintain tandem stance for at least 20 sec to demo improved balance.   11/17: <1 sec R & L Goal status: NEW  PLAN:  PT FREQUENCY: 2x/week  PT DURATION: 6 weeks  PLANNED INTERVENTIONS: 97110-Therapeutic exercises, 97530- Therapeutic activity, 97112- Neuromuscular re-education, 97535- Self Care, and 02859- Manual therapy  PLAN FOR NEXT SESSION: Nustep, UBE, gait and balance activities, U and LE strengthening.     Tahlor Berenguer April Ma L Carnie Bruemmer, PT, DPT 09/04/2024, 1:15 PM

## 2024-09-09 ENCOUNTER — Ambulatory Visit: Attending: Gastroenterology | Admitting: Physical Therapy

## 2024-09-09 DIAGNOSIS — M6281 Muscle weakness (generalized): Secondary | ICD-10-CM | POA: Insufficient documentation

## 2024-09-09 DIAGNOSIS — R2689 Other abnormalities of gait and mobility: Secondary | ICD-10-CM | POA: Insufficient documentation

## 2024-09-09 NOTE — Therapy (Signed)
 OUTPATIENT PHYSICAL THERAPY TREATMENT  Patient Name: Kim Harris MRN: [id number] DOB:[birth year], 68 y.o., female Today's Date: 09/09/2024  END OF SESSION:  PT End of Session - 09/09/24 1303     Visit Number 12    Number of Visits 18    Date for Recertification  10/07/24    Authorization Type [employer] Medicare    Authorization Time Period 12 visits approved 06/19/24 - 08/28/24; 6 visits approved 08/29/24-09/26/24    Authorization - Visit Number 3    Authorization - Number of Visits 6    PT Start Time 1300    PT Stop Time 1340    PT Time Calculation (min) 40 min    Activity Tolerance Patient tolerated treatment well    Behavior During Therapy WFL for tasks assessed/performed           Past Medical History:  Diagnosis Date   Anasarca    Anemia    Anxiety    Cirrhosis (HCC)    Depression    HCV (hepatitis C virus)    Hepatitis    c   Pneumonia    Septic prepatellar bursitis of left knee 05/18/2017   Past Surgical History:  Procedure Laterality Date   CHOLECYSTECTOMY     I & D EXTREMITY Left 05/18/2017   Procedure: IRRIGATION AND DEBRIDEMENT INFECTED LEFT KNEE WITH PRE-PATELLA BURSECTOMY;  Surgeon: Josefina Chew, MD;  Location: [facility] OR;  Service: Orthopedics;  Laterality: Left;   Patient Active Problem List   Diagnosis Date Noted   Septic prepatellar bursitis of left knee 05/18/2017   HEPATITIS C 02/07/2010   Hepatic cirrhosis (HCC) 02/07/2010   ALCOHOLIC CIRRHOSIS OF LIVER 02/04/2010   Disorder of liver 02/02/2010   REFERRING PROVIDER: RONAL Donna Magnuson MD  REFERRING DIAG: Debility, weakness.  THERAPY DIAG:  Other abnormalities of gait and mobility  Muscle weakness (generalized)  Rationale for Evaluation and Treatment: Rehabilitation  ONSET DATE: Ongoing.    SUBJECTIVE:   SUBJECTIVE STATEMENT: Pt reports she had better sleep last night. No new complaints.   PERTINENT HISTORY: Please see above.    PAIN:  Are you having pain? No  PRECAUTIONS: Fall.   Patient had a fall related to leaning on a unstable table and once when she felt exhausted.  Recommended she use a cane.    RED FLAGS: None   WEIGHT BEARING RESTRICTIONS: No  FALLS:  Has patient fallen in last 6 months? Yes. Number of falls 2.  LIVING ENVIRONMENT: Lives in: House/apartment Has following equipment at home: None  PLOF: Independent with basic ADLs  PATIENT GOALS: Get stronger.    OBJECTIVE:  POSTURE: rounded shoulders, forward head, and flexed trunk    LOWER/UPPER EXTREMITY ROM: WFL.  LOWER/UPPER EXTREMITY MMT: Bilateral deltoid strength is 4-/5, bilateral elbow flexion is solid 4/5, right grip is 18# and left is 14#.  Bilateral hip flexion strength is 4/5, bilateral knee extension is 4+/5.  Normal bilateral ankle strength.  FUNCTIONAL TESTS:  5 times sit to stand: 13 seconds Timed up and go (TUG): 18 seconds.  GAIT: Patient walks with a normal cadence but has decreased foot clearance.  Recommended she use a cane at all times for safety.  TREATMENT DATE:   09/09/24   EXERCISE LOG  Exercise Repetitions and Resistance Comments  Nustep Lvl 5-6 x 15 mins maintaining >50 SPM Cues and encouragement to maintain SPM  Sit<>stand 8# ball 2x10   Standing marching 8# ball 2x5   8# medball slam fwd and then diagonals  x10   Feet together on airex, bilat UE flexion holding on one 3# weight 2x10   Holding on 3# weight side step up and over airex pad 2x5 Required multiple verbal and tactile cues; tends to want to take hands off weight                   Blank cell = exercise not performed today    09/02/24   EXERCISE LOG  Exercise Repetitions and Resistance Comments  Nustep Lvl 6 x 15 mins maintaining >50 SPM Cues and encouragement to maintain SPM  Seated piriformis stretch X30   Standing marching 4# 2x30   Standing hip abduction 4#  2x30 SpO2 dropped to 90% but recovered with rest  Standing hip ext 4# 2x30 SpO2 drop to 92%, HR up to 100 BPM; recovers with rest  Sitting LAQ 4# 2x30 each   Tandem stance 2x30                    Blank cell = exercise not performed today    08/28/24   EXERCISE LOG  Exercise Repetitions and Resistance Comments  Nustep Lvl 2 x 15 mins maintaining >50 SPM Cues and encouragement to maintain SPM  Seated marching side step over cone 3# 2x10   Seated LAQ 3# X 2 min   Sit<>stand 4# 2X30 sec   Side step up and over 4 box x15 Required increased cueing -- especially with fatigue  One foot on 4 box in partial tandem 2x 30 sec                        Blank cell = exercise not performed today    08/26/24   EXERCISE LOG  Exercise Repetitions and Resistance Comments  Nustep Lvl 3 x 15 mins   Stairs 4 stairs step-to-pattern 1 HA   Seated Target Tapping Cone x 15 reps bil   Tandem Stance ~1 second   LAQs    Seated Marches    Seated Hip Abduction    Seated Ham Curls    STS    Side step    Fwd and bwd monster walk    Goal Assessment See Below    Blank cell = exercise not performed today      PATIENT EDUCATION:  Education details: HEP Person educated: Patient Education method: Industrial/product Designer, demonstration, handout Education comprehension: Verbalized understanding, demonstrated to PT  HOME EXERCISE PROGRAM: Access Code: [email or web address] URL: [email or web address] Date: 08/05/2024 Prepared by: Johanna Matto April Earnie Starring  Exercises - Seated March with Resistance  - 1 x daily - 7 x weekly - 2 sets - 10 reps - Seated Isometric Hip Abduction with Resistance  - 1 x daily - 7 x weekly - 2 sets - 10 reps - Seated Knee Extension with Resistance  - 1 x daily - 7 x weekly - 2 sets - 10 reps - Seated Hamstring Curls with Resistance  - 1 x daily - 7 x weekly - 2 sets - 10 reps - Sit to Stand Without Arm Support  - 1 x daily - 7 x weekly - 2 sets - 10 reps - Side Stepping with  Resistance at Ankles  - 1 x daily - 7 x weekly - 2 sets - 10 reps - Backward Monster Walks  - 1 x daily - 7 x weekly - 2 sets - 10 reps - Forward Monster Walks  - 1 x daily - 7 x weekly - 2 sets - 10 reps  ASSESSMENT:  CLINICAL IMPRESSION:  Session focused on improving pt's balance and core/LE strength. Added med ball exercises today with good pt tolerance. Requires intermittent rest due to fatigue and SHOB. SpO2 better maintained today at >93%.   OBJECTIVE IMPAIRMENTS: Abnormal gait, decreased activity tolerance, and decreased endurance.   ACTIVITY LIMITATIONS: carrying, lifting, bending, and locomotion level  PARTICIPATION LIMITATIONS: meal prep, cleaning, laundry, community activity, and yard work  PERSONAL FACTORS: Time since onset of injury/illness/exacerbation and 1 comorbidity: liver issues are also affecting patient's functional outcome.   REHAB POTENTIAL: Fair plus/Good minus  CLINICAL DECISION MAKING: Evolving/moderate complexity  EVALUATION COMPLEXITY: Low   GOALS:  SHORT TERM GOALS: Target date: 07/03/24  Ind with a HEP. Goal status: MET   LONG TERM GOALS: Target date: 10/07/2024   Improve 5 time sit to stand by 2-3 seconds.  10/15: 12.2 seconds (.8 second decrease); 11/17: 11 seconds (2 second decrease)  Goal status: MET  2.  Improve TUG by 2-3 seconds.   10/15: 16.5 seconds (1.5 seconds decrease); 11/17: 14.77 seconds (3.23 second decrease) Goal status: MET  3.  Walk walk in clinic with with a straight cane 500 feet with normal toe clearance.   10/15: pt ambulates without AD Goal status: MET  4.  Will be able to ascend/descend 5 stairs with a reciprocal pattern and 1 hand rail for safety to demo improving hip strength.   11/17: step to pattern with 1 hand rail (L LE leading) Goal status: NEW  5.  Will be able to maintain tandem stance for at least 20 sec to demo improved balance.   11/17: <1 sec R & L Goal status: NEW  PLAN:  PT FREQUENCY:  2x/week  PT DURATION: 6 weeks  PLANNED INTERVENTIONS: 97110-Therapeutic exercises, 97530- Therapeutic activity, 97112- Neuromuscular re-education, 97535- Self Care, and 02859- Manual therapy  PLAN FOR NEXT SESSION: Nustep, UBE, gait and balance activities, U and LE strengthening.     Anaston Koehn April Ma L Derron Pipkins, PT, DPT 09/09/2024, 1:05 PM

## 2024-09-11 ENCOUNTER — Ambulatory Visit

## 2024-09-11 DIAGNOSIS — M6281 Muscle weakness (generalized): Secondary | ICD-10-CM

## 2024-09-11 DIAGNOSIS — R2689 Other abnormalities of gait and mobility: Secondary | ICD-10-CM | POA: Diagnosis not present

## 2024-09-11 NOTE — Therapy (Signed)
 OUTPATIENT PHYSICAL THERAPY TREATMENT  Patient Name: Kim Harris MRN: [id number] DOB:[birth year], 68 y.o., female Today's Date: 09/11/2024  END OF SESSION:  PT End of Session - 09/11/24 1303     Visit Number 13    Number of Visits 18    Date for Recertification  10/07/24    Authorization Type [employer] Medicare    Authorization Time Period 12 visits approved 06/19/24 - 08/28/24; 6 visits approved 08/29/24-09/26/24    Authorization - Number of Visits 6    PT Start Time 1300    PT Stop Time 1344    PT Time Calculation (min) 44 min    Activity Tolerance Patient tolerated treatment well    Behavior During Therapy WFL for tasks assessed/performed           Past Medical History:  Diagnosis Date   Anasarca    Anemia    Anxiety    Cirrhosis (HCC)    Depression    HCV (hepatitis C virus)    Hepatitis    c   Pneumonia    Septic prepatellar bursitis of left knee 05/18/2017   Past Surgical History:  Procedure Laterality Date   CHOLECYSTECTOMY     I & D EXTREMITY Left 05/18/2017   Procedure: IRRIGATION AND DEBRIDEMENT INFECTED LEFT KNEE WITH PRE-PATELLA BURSECTOMY;  Surgeon: Josefina Chew, MD;  Location: [facility];  Service: Orthopedics;  Laterality: Left;   Patient Active Problem List   Diagnosis Date Noted   Septic prepatellar bursitis of left knee 05/18/2017   HEPATITIS C 02/07/2010   Hepatic cirrhosis (HCC) 02/07/2010   ALCOHOLIC CIRRHOSIS OF LIVER 02/04/2010   Disorder of liver 02/02/2010   REFERRING PROVIDER: RONAL Donna Magnuson MD  REFERRING DIAG: Debility, weakness.  THERAPY DIAG:  Other abnormalities of gait and mobility  Muscle weakness (generalized)  Rationale for Evaluation and Treatment: Rehabilitation  ONSET DATE: Ongoing.    SUBJECTIVE:   SUBJECTIVE STATEMENT: No new complaints.   PERTINENT HISTORY: Please see above.    PAIN:  Are you having pain? No  PRECAUTIONS: Fall.  Patient had a fall related to leaning on a unstable table and once when she felt  exhausted.  Recommended she use a cane.    RED FLAGS: None   WEIGHT BEARING RESTRICTIONS: No  FALLS:  Has patient fallen in last 6 months? Yes. Number of falls 2.  LIVING ENVIRONMENT: Lives in: House/apartment Has following equipment at home: None  PLOF: Independent with basic ADLs  PATIENT GOALS: Get stronger.    OBJECTIVE:  POSTURE: rounded shoulders, forward head, and flexed trunk    LOWER/UPPER EXTREMITY ROM: WFL.  LOWER/UPPER EXTREMITY MMT: Bilateral deltoid strength is 4-/5, bilateral elbow flexion is solid 4/5, right grip is 18# and left is 14#.  Bilateral hip flexion strength is 4/5, bilateral knee extension is 4+/5.  Normal bilateral ankle strength.  FUNCTIONAL TESTS:  5 times sit to stand: 13 seconds Timed up and go (TUG): 18 seconds.  GAIT: Patient walks with a normal cadence but has decreased foot clearance.  Recommended she use a cane at all times for safety.  TREATMENT DATE:     09/11/24                                  EXERCISE LOG  Exercise Repetitions and Resistance Comments  Nustep  Lvl 5-6 x 15 mins maintaining >50 SPM Cues and encouragement for SPM  Cybex Knee Flexion 20# x 3 mins   Cybex Knee Extension 10# x 3 mins   STS 8# ball 2 sets 12 reps   Resisted Side Stepping Red x 5 laps in parallel bars    Blank cell = exercise not performed today   09/09/24   EXERCISE LOG  Exercise Repetitions and Resistance Comments  Nustep Lvl 5-6 x 15 mins maintaining >50 SPM Cues and encouragement to maintain SPM  Sit<>stand 8# ball 2x10   Standing marching 8# ball 2x5   8# medball slam fwd and then diagonals  x10   Feet together on airex, bilat UE flexion holding on one 3# weight 2x10   Holding on 3# weight side step up and over airex pad 2x5 Required multiple verbal and tactile cues; tends to want to take hands off weight                    Blank cell = exercise not performed today    09/02/24   EXERCISE LOG  Exercise Repetitions and Resistance Comments  Nustep Lvl 6 x 15 mins maintaining >50 SPM Cues and encouragement to maintain SPM  Seated piriformis stretch X30   Standing marching 4# 2x30   Standing hip abduction 4# 2x30 SpO2 dropped to 90% but recovered with rest  Standing hip ext 4# 2x30 SpO2 drop to 92%, HR up to 100 BPM; recovers with rest  Sitting LAQ 4# 2x30 each   Tandem stance 2x30                    Blank cell = exercise not performed today      PATIENT EDUCATION:  Education details: HEP Person educated: Patient Education method: Verbal, demonstration, handout Education comprehension: Verbalized understanding, demonstrated to PT  HOME EXERCISE PROGRAM: Access Code: [email or web address] URL: [email or web address] Date: 08/05/2024 Prepared by: Gellen April Earnie Starring  Exercises - Seated March with Resistance  - 1 x daily - 7 x weekly - 2 sets - 10 reps - Seated Isometric Hip Abduction with Resistance  - 1 x daily - 7 x weekly - 2 sets - 10 reps - Seated Knee Extension with Resistance  - 1 x daily - 7 x weekly - 2 sets - 10 reps - Seated Hamstring Curls with Resistance  - 1 x daily - 7 x weekly - 2 sets - 10 reps - Sit to Stand Without Arm Support  - 1 x daily - 7 x weekly - 2 sets - 10 reps - Side Stepping with Resistance at Ankles  - 1 x daily - 7 x weekly - 2 sets - 10 reps - Backward Monster Walks  - 1 x daily - 7 x weekly - 2 sets - 10 reps - Forward Monster Walks  - 1 x daily - 7 x weekly - 2 sets - 10 reps  ASSESSMENT:  CLINICAL IMPRESSION: Pt arrives for today's treatment session denying any pain, but does report slight fatigue.  Pt introduced to cybex machinery today with min cues required for full ROM and eccentric control.  Pt able to tolerate increased rep with weighted  sit to stands today with fatigue noted.  Pt challenged by resisted side stepping in  parallel bars with encouragement required to decrease UE support.  Pt denied any pain at completion of today's treatment session.  OBJECTIVE IMPAIRMENTS: Abnormal gait, decreased activity tolerance, and decreased endurance.   ACTIVITY LIMITATIONS: carrying, lifting, bending, and locomotion level  PARTICIPATION LIMITATIONS: meal prep, cleaning, laundry, community activity, and yard work  PERSONAL FACTORS: Time since onset of injury/illness/exacerbation and 1 comorbidity: liver issues are also affecting patient's functional outcome.   REHAB POTENTIAL: Fair plus/Good minus  CLINICAL DECISION MAKING: Evolving/moderate complexity  EVALUATION COMPLEXITY: Low   GOALS:  SHORT TERM GOALS: Target date: 07/03/24  Ind with a HEP. Goal status: MET   LONG TERM GOALS: Target date: 10/07/2024   Improve 5 time sit to stand by 2-3 seconds.  10/15: 12.2 seconds (.8 second decrease); 11/17: 11 seconds (2 second decrease)  Goal status: MET  2.  Improve TUG by 2-3 seconds.   10/15: 16.5 seconds (1.5 seconds decrease); 11/17: 14.77 seconds (3.23 second decrease) Goal status: MET  3.  Walk walk in clinic with with a straight cane 500 feet with normal toe clearance.   10/15: pt ambulates without AD Goal status: MET  4.  Will be able to ascend/descend 5 stairs with a reciprocal pattern and 1 hand rail for safety to demo improving hip strength.   11/17: step to pattern with 1 hand rail (L LE leading) Goal status: NEW  5.  Will be able to maintain tandem stance for at least 20 sec to demo improved balance.   11/17: <1 sec R & L Goal status: NEW  PLAN:  PT FREQUENCY: 2x/week  PT DURATION: 6 weeks  PLANNED INTERVENTIONS: 97110-Therapeutic exercises, 97530- Therapeutic activity, 97112- Neuromuscular re-education, 97535- Self Care, and 02859- Manual therapy  PLAN FOR NEXT SESSION: Nustep, UBE, gait and balance activities, U and LE strengthening.     Delon DELENA Gosling, PTA, DPT 09/11/2024,  1:58 PM

## 2024-09-16 ENCOUNTER — Ambulatory Visit

## 2024-09-18 ENCOUNTER — Ambulatory Visit: Admitting: Physical Therapy

## 2024-09-18 DIAGNOSIS — R2689 Other abnormalities of gait and mobility: Secondary | ICD-10-CM

## 2024-09-18 DIAGNOSIS — M6281 Muscle weakness (generalized): Secondary | ICD-10-CM

## 2024-09-18 NOTE — Therapy (Signed)
 OUTPATIENT PHYSICAL THERAPY TREATMENT  Patient Name: Kim Harris MRN: [id number] DOB:05-24-1956, 68 y.o., female Today's Date: 09/18/2024  END OF SESSION:  PT End of Session - 09/18/24 1434     Visit Number 14    Number of Visits 18    Date for Recertification  10/07/24    Authorization Type [employer] Medicare    Authorization Time Period 12 visits approved 06/19/24 - 08/28/24; 6 visits approved 08/29/24-09/26/24    Authorization - Number of Visits 6    PT Start Time 1430    PT Stop Time 1515    PT Time Calculation (min) 45 min    Activity Tolerance Patient tolerated treatment well    Behavior During Therapy WFL for tasks assessed/performed           Past Medical History:  Diagnosis Date   Anasarca    Anemia    Anxiety    Cirrhosis (HCC)    Depression    HCV (hepatitis C virus)    Hepatitis    c   Pneumonia    Septic prepatellar bursitis of left knee 05/18/2017   Past Surgical History:  Procedure Laterality Date   CHOLECYSTECTOMY     I & D EXTREMITY Left 05/18/2017   Procedure: IRRIGATION AND DEBRIDEMENT INFECTED LEFT KNEE WITH PRE-PATELLA BURSECTOMY;  Surgeon: Josefina Chew, MD;  Location: [facility] OR;  Service: Orthopedics;  Laterality: Left;   Patient Active Problem List   Diagnosis Date Noted   Septic prepatellar bursitis of left knee 05/18/2017   HEPATITIS C 02/07/2010   Hepatic cirrhosis (HCC) 02/07/2010   ALCOHOLIC CIRRHOSIS OF LIVER 02/04/2010   Disorder of liver 02/02/2010   REFERRING PROVIDER: RONAL Donna Magnuson MD  REFERRING DIAG: Debility, weakness.  THERAPY DIAG:  Other abnormalities of gait and mobility  Muscle weakness (generalized)  Rationale for Evaluation and Treatment: Rehabilitation  ONSET DATE: Ongoing.    SUBJECTIVE:   SUBJECTIVE STATEMENT: No new complaints.   PERTINENT HISTORY: Please see above.    PAIN:  Are you having pain? No  PRECAUTIONS: Fall.  Patient had a fall related to leaning on a unstable table and once when she  felt exhausted.  Recommended she use a cane.    RED FLAGS: None   WEIGHT BEARING RESTRICTIONS: No  FALLS:  Has patient fallen in last 6 months? Yes. Number of falls 2.  LIVING ENVIRONMENT: Lives in: House/apartment Has following equipment at home: None  PLOF: Independent with basic ADLs  PATIENT GOALS: Get stronger.    OBJECTIVE:  POSTURE: rounded shoulders, forward head, and flexed trunk    LOWER/UPPER EXTREMITY ROM: WFL.  LOWER/UPPER EXTREMITY MMT: Bilateral deltoid strength is 4-/5, bilateral elbow flexion is solid 4/5, right grip is 18# and left is 14#.  Bilateral hip flexion strength is 4/5, bilateral knee extension is 4+/5.  Normal bilateral ankle strength.  FUNCTIONAL TESTS:  5 times sit to stand: 13 seconds Timed up and go (TUG): 18 seconds.  GAIT: Patient walks with a normal cadence but has decreased foot clearance.  Recommended she use a cane at all times for safety.  TREATMENT DATE:    09/18/24                                  EXERCISE LOG  Exercise Repetitions and Resistance Comments  Nustep  Lvl 5-6 x 15 mins maintaining >50 SPM Cues and encouragement for SPM  Cybex Knee Flexion 20# x 4 mins   Cybex Knee Extension 10# x 4 mins   Seated Marches 4# to fatigue   STS 8# ball 2 sets 3reps   Resisted Side Stepping Red x 6 laps in parallel bars    Blank cell = exercise not performed today   09/09/24   EXERCISE LOG  Exercise Repetitions and Resistance Comments  Nustep Lvl 5-6 x 15 mins maintaining >50 SPM Cues and encouragement to maintain SPM  Sit<>stand 8# ball 2x10   Standing marching 8# ball 2x5   8# medball slam fwd and then diagonals  x10   Feet together on airex, bilat UE flexion holding on one 3# weight 2x10   Holding on 3# weight side step up and over airex pad 2x5 Required multiple verbal and tactile cues; tends to  want to take hands off weight                   Blank cell = exercise not performed today    09/02/24   EXERCISE LOG  Exercise Repetitions and Resistance Comments  Nustep Lvl 6 x 15 mins maintaining >50 SPM Cues and encouragement to maintain SPM  Seated piriformis stretch X30   Standing marching 4# 2x30   Standing hip abduction 4# 2x30 SpO2 dropped to 90% but recovered with rest  Standing hip ext 4# 2x30 SpO2 drop to 92%, HR up to 100 BPM; recovers with rest  Sitting LAQ 4# 2x30 each   Tandem stance 2x30                    Blank cell = exercise not performed today      PATIENT EDUCATION:  Education details: HEP Person educated: Patient Education method: Verbal, demonstration, handout Education comprehension: Verbalized understanding, demonstrated to PT  HOME EXERCISE PROGRAM: Access Code: [email or web address] URL: [email or web address] Date: 08/05/2024 Prepared by: Gellen April Earnie Starring  Exercises - Seated March with Resistance  - 1 x daily - 7 x weekly - 2 sets - 10 reps - Seated Isometric Hip Abduction with Resistance  - 1 x daily - 7 x weekly - 2 sets - 10 reps - Seated Knee Extension with Resistance  - 1 x daily - 7 x weekly - 2 sets - 10 reps - Seated Hamstring Curls with Resistance  - 1 x daily - 7 x weekly - 2 sets - 10 reps - Sit to Stand Without Arm Support  - 1 x daily - 7 x weekly - 2 sets - 10 reps - Side Stepping with Resistance at Ankles  - 1 x daily - 7 x weekly - 2 sets - 10 reps - Backward Monster Walks  - 1 x daily - 7 x weekly - 2 sets - 10 reps - Forward Monster Walks  - 1 x daily - 7 x weekly - 2 sets - 10 reps  ASSESSMENT:  CLINICAL IMPRESSION: Pt arrives for today's treatment session denying any pain.   Pt able to tolerate increased time with cybex exercises today with fatigue noted.  Pt given seated rest breaks as needed.  Pt able  to tolerate increased reps with all other exercises today as well.  Pt fatigued at completion of  today's treatment session, but denies any pain.   OBJECTIVE IMPAIRMENTS: Abnormal gait, decreased activity tolerance, and decreased endurance.   ACTIVITY LIMITATIONS: carrying, lifting, bending, and locomotion level  PARTICIPATION LIMITATIONS: meal prep, cleaning, laundry, community activity, and yard work  PERSONAL FACTORS: Time since onset of injury/illness/exacerbation and 1 comorbidity: liver issues are also affecting patient's functional outcome.   REHAB POTENTIAL: Fair plus/Good minus  CLINICAL DECISION MAKING: Evolving/moderate complexity  EVALUATION COMPLEXITY: Low   GOALS:  SHORT TERM GOALS: Target date: 07/03/24  Ind with a HEP. Goal status: MET   LONG TERM GOALS: Target date: 10/07/2024   Improve 5 time sit to stand by 2-3 seconds.  10/15: 12.2 seconds (.8 second decrease); 11/17: 11 seconds (2 second decrease)  Goal status: MET  2.  Improve TUG by 2-3 seconds.   10/15: 16.5 seconds (1.5 seconds decrease); 11/17: 14.77 seconds (3.23 second decrease) Goal status: MET  3.  Walk walk in clinic with with a straight cane 500 feet with normal toe clearance.   10/15: pt ambulates without AD Goal status: MET  4.  Will be able to ascend/descend 5 stairs with a reciprocal pattern and 1 hand rail for safety to demo improving hip strength.   11/17: step to pattern with 1 hand rail (L LE leading) Goal status: NEW  5.  Will be able to maintain tandem stance for at least 20 sec to demo improved balance.   11/17: <1 sec R & L Goal status: NEW  PLAN:  PT FREQUENCY: 2x/week  PT DURATION: 6 weeks  PLANNED INTERVENTIONS: 97110-Therapeutic exercises, 97530- Therapeutic activity, 97112- Neuromuscular re-education, 97535- Self Care, and 02859- Manual therapy  PLAN FOR NEXT SESSION: Nustep, UBE, gait and balance activities, U and LE strengthening.     Delon DELENA Gosling, PTA, DPT 09/18/2024, 3:18 PM

## 2024-09-23 ENCOUNTER — Ambulatory Visit: Admitting: Physical Therapy

## 2024-09-23 DIAGNOSIS — R2689 Other abnormalities of gait and mobility: Secondary | ICD-10-CM | POA: Diagnosis not present

## 2024-09-23 DIAGNOSIS — M6281 Muscle weakness (generalized): Secondary | ICD-10-CM

## 2024-09-23 NOTE — Therapy (Signed)
 OUTPATIENT PHYSICAL THERAPY TREATMENT  Patient Name: Kim Harris MRN: [id number] DOB:03-29-1956, 68 y.o., female Today's Date: 09/23/2024  END OF SESSION:  PT End of Session - 09/23/24 1103     Visit Number 15    Number of Visits 18    Date for Recertification  10/07/24    Authorization Type [employer] Medicare    Authorization Time Period 12 visits approved 06/19/24 - 08/28/24; 6 visits approved 08/29/24-09/26/24    Authorization - Visit Number 6    Authorization - Number of Visits 6    PT Start Time 1100    PT Stop Time 1140    PT Time Calculation (min) 40 min    Activity Tolerance Patient tolerated treatment well    Behavior During Therapy WFL for tasks assessed/performed           Past Medical History:  Diagnosis Date   Anasarca    Anemia    Anxiety    Cirrhosis (HCC)    Depression    HCV (hepatitis C virus)    Hepatitis    c   Pneumonia    Septic prepatellar bursitis of left knee 05/18/2017   Past Surgical History:  Procedure Laterality Date   CHOLECYSTECTOMY     I & D EXTREMITY Left 05/18/2017   Procedure: IRRIGATION AND DEBRIDEMENT INFECTED LEFT KNEE WITH PRE-PATELLA BURSECTOMY;  Surgeon: Josefina Chew, MD;  Location: [facility] OR;  Service: Orthopedics;  Laterality: Left;   Patient Active Problem List   Diagnosis Date Noted   Septic prepatellar bursitis of left knee 05/18/2017   HEPATITIS C 02/07/2010   Hepatic cirrhosis (HCC) 02/07/2010   ALCOHOLIC CIRRHOSIS OF LIVER 02/04/2010   Disorder of liver 02/02/2010   REFERRING PROVIDER: RONAL Donna Magnuson MD  REFERRING DIAG: Debility, weakness.  THERAPY DIAG:  Other abnormalities of gait and mobility  Muscle weakness (generalized)  Rationale for Evaluation and Treatment: Rehabilitation  ONSET DATE: Ongoing.    SUBJECTIVE:   SUBJECTIVE STATEMENT: Pt states she's been doing her exercises at home. Reports she needs to get blood work done today so will need to be out of PT on time.    PERTINENT  HISTORY: Please see above.    PAIN:  Are you having pain? No  PRECAUTIONS: Fall.  Patient had a fall related to leaning on a unstable table and once when she felt exhausted.  Recommended she use a cane.    RED FLAGS: None   WEIGHT BEARING RESTRICTIONS: No  FALLS:  Has patient fallen in last 6 months? Yes. Number of falls 2.  LIVING ENVIRONMENT: Lives in: House/apartment Has following equipment at home: None  PLOF: Independent with basic ADLs  PATIENT GOALS: Get stronger.    OBJECTIVE:  POSTURE: rounded shoulders, forward head, and flexed trunk    LOWER/UPPER EXTREMITY ROM: WFL.  LOWER/UPPER EXTREMITY MMT: Bilateral deltoid strength is 4-/5, bilateral elbow flexion is solid 4/5, right grip is 18# and left is 14#.  Bilateral hip flexion strength is 4/5, bilateral knee extension is 4+/5.  Normal bilateral ankle strength.  FUNCTIONAL TESTS:  5 times sit to stand: 13 seconds Timed up and go (TUG): 18 seconds.  GAIT: Patient walks with a normal cadence but has decreased foot clearance.  Recommended she use a cane at all times for safety.  TREATMENT DATE:   09/23/24                                  EXERCISE LOG  Exercise Repetitions and Resistance Comments  Nustep  Lvl 5-6 x 15 mins maintaining >50 SPM Cues and encouragement for SPM  Sit<>stand 3x5   Standing hip adduction 2x10 R&L   Wide tandem stance 3x10 Requires increased cueing to keep foot placement and not use UEs  Ascend/descend 5 steps  x 4 Cueing for reciprocal pattern, 1 UE hand hold           Blank cell = exercise not performed today    09/18/24                                  EXERCISE LOG  Exercise Repetitions and Resistance Comments  Nustep  Lvl 5-6 x 15 mins maintaining >50 SPM Cues and encouragement for SPM  Cybex Knee Flexion 20# x 4 mins   Cybex Knee Extension 10# x  4 mins   Seated Marches 4# to fatigue   STS 8# ball 2 sets 3reps   Resisted Side Stepping Red x 6 laps in parallel bars    Blank cell = exercise not performed today   09/09/24   EXERCISE LOG  Exercise Repetitions and Resistance Comments  Nustep Lvl 5-6 x 15 mins maintaining >50 SPM Cues and encouragement to maintain SPM  Sit<>stand 8# ball 2x10   Standing marching 8# ball 2x5   8# medball slam fwd and then diagonals  x10   Feet together on airex, bilat UE flexion holding on one 3# weight 2x10   Holding on 3# weight side step up and over airex pad 2x5 Required multiple verbal and tactile cues; tends to want to take hands off weight                   Blank cell = exercise not performed today       PATIENT EDUCATION:  Education details: HEP Person educated: Patient Education method: Verbal, demonstration, handout Education comprehension: Verbalized understanding, demonstrated to PT  HOME EXERCISE PROGRAM: Access Code: [email or web address] URL: [email or web address] Date: 08/05/2024 Prepared by: Alexys Gassett April Earnie Starring  Exercises - Seated March with Resistance  - 1 x daily - 7 x weekly - 2 sets - 10 reps - Seated Isometric Hip Abduction with Resistance  - 1 x daily - 7 x weekly - 2 sets - 10 reps - Seated Knee Extension with Resistance  - 1 x daily - 7 x weekly - 2 sets - 10 reps - Seated Hamstring Curls with Resistance  - 1 x daily - 7 x weekly - 2 sets - 10 reps - Sit to Stand Without Arm Support  - 1 x daily - 7 x weekly - 2 sets - 10 reps - Side Stepping with Resistance at Ankles  - 1 x daily - 7 x weekly - 2 sets - 10 reps - Backward Monster Walks  - 1 x daily - 7 x weekly - 2 sets - 10 reps - Forward Monster Walks  - 1 x daily - 7 x weekly - 2 sets - 10 reps  ASSESSMENT:  CLINICAL IMPRESSION: Last authorized visit by insurance. Pt struggles with eccentric control to perform stair descent in a reciprocal pattern -- especially on the right. Heavy PT cueing  and  facilitation required to obtain a reciprocal pattern. Limited by fatigue. Requires increased verbal and tactile cueing/supervision to try and perform tandem stance without compensating but she is able to do it for 10 sec with SBA. At this point she has not met her current LTGs but continues to try to progress towards them. Pt will benefit from a few more visits to fully realize her goals for safer mobility.   OBJECTIVE IMPAIRMENTS: Abnormal gait, decreased activity tolerance, and decreased endurance.   ACTIVITY LIMITATIONS: carrying, lifting, bending, and locomotion level  PARTICIPATION LIMITATIONS: meal prep, cleaning, laundry, community activity, and yard work  PERSONAL FACTORS: Time since onset of injury/illness/exacerbation and 1 comorbidity: liver issues are also affecting patient's functional outcome.   REHAB POTENTIAL: Fair plus/Good minus  CLINICAL DECISION MAKING: Evolving/moderate complexity  EVALUATION COMPLEXITY: Low   GOALS:  SHORT TERM GOALS: Target date: 07/03/24  Ind with a HEP. Goal status: MET   LONG TERM GOALS: Target date: 10/07/2024   Improve 5 time sit to stand by 2-3 seconds.  10/15: 12.2 seconds (.8 second decrease); 11/17: 11 seconds (2 second decrease)  Goal status: MET  2.  Improve TUG by 2-3 seconds.   10/15: 16.5 seconds (1.5 seconds decrease); 11/17: 14.77 seconds (3.23 second decrease) Goal status: MET  3.  Walk walk in clinic with with a straight cane 500 feet with normal toe clearance.   10/15: pt ambulates without AD Goal status: MET  4.  Will be able to ascend/descend 5 stairs with a reciprocal pattern and 1 hand rail for safety to demo improving hip strength.   11/17: step to pattern with 1 hand rail (L LE leading)  12/15: Able with heavy cueing and SBA -- challenged with eccentric control of R LE during step downs Goal status: IN PROGRESS  5.  Will be able to maintain tandem stance for at least 20 sec to demo improved balance.   11/17:  <1 sec R & L  12/15: able to maintain 10 sec in wide tandem with SBA Goal status: IN PROGRESS   PLAN:  PT FREQUENCY: 1-2x/week  PT DURATION: 6 weeks  PLANNED INTERVENTIONS: 97110-Therapeutic exercises, 97530- Therapeutic activity, 97112- Neuromuscular re-education, 97535- Self Care, and 02859- Manual therapy  PLAN FOR NEXT SESSION: Nustep, tandem stance/narrow BOS for balance, stairs/steps for strengthening and reciprocal pattern   Asyah Candler April Ma L Paelyn Smick, PT, DPT 09/23/2024, 11:04 AM

## 2024-10-28 ENCOUNTER — Encounter: Payer: Self-pay | Admitting: Physical Therapy

## 2024-10-28 ENCOUNTER — Other Ambulatory Visit: Payer: Self-pay

## 2024-10-28 ENCOUNTER — Ambulatory Visit: Attending: Gastroenterology | Admitting: Physical Therapy

## 2024-10-28 DIAGNOSIS — M6281 Muscle weakness (generalized): Secondary | ICD-10-CM | POA: Insufficient documentation

## 2024-10-28 DIAGNOSIS — R2689 Other abnormalities of gait and mobility: Secondary | ICD-10-CM | POA: Insufficient documentation

## 2024-10-28 NOTE — Therapy (Signed)
 " OUTPATIENT PHYSICAL THERAPY EVALUATION  Patient Name: Kim Harris MRN: [id number] DOB:06/15/1956, 69 y.o., female Today's Date: 10/28/2024  END OF SESSION:  PT End of Session - 10/28/24 1111     Visit Number 1    Authorization Type [employer] Medicare    PT Start Time 1110   late arrival   PT Stop Time 1145    PT Time Calculation (min) 35 min    Activity Tolerance Patient tolerated treatment well            Past Medical History:  Diagnosis Date   Anasarca    Anemia    Anxiety    Cirrhosis (HCC)    Depression    HCV (hepatitis C virus)    Hepatitis    c   Pneumonia    Septic prepatellar bursitis of left knee 05/18/2017   Past Surgical History:  Procedure Laterality Date   CHOLECYSTECTOMY     I & D EXTREMITY Left 05/18/2017   Procedure: IRRIGATION AND DEBRIDEMENT INFECTED LEFT KNEE WITH PRE-PATELLA BURSECTOMY;  Surgeon: Josefina Chew, MD;  Location: [facility] OR;  Service: Orthopedics;  Laterality: Left;   Patient Active Problem List   Diagnosis Date Noted   Septic prepatellar bursitis of left knee 05/18/2017   HEPATITIS C 02/07/2010   Hepatic cirrhosis (HCC) 02/07/2010   ALCOHOLIC CIRRHOSIS OF LIVER 02/04/2010   Disorder of liver 02/02/2010   REFERRING PROVIDER: RONAL Donna Magnuson MD  REFERRING DIAG: Debility, weakness.  THERAPY DIAG:  Other abnormalities of gait and mobility  Muscle weakness (generalized)  Rationale for Evaluation and Treatment: Rehabilitation  ONSET DATE: Ongoing.    SUBJECTIVE:   SUBJECTIVE STATEMENT: Pt states she's been doing her exercises with her sister. Has not joined a gym -- wanted a few more visits of PT.   PERTINENT HISTORY: Please see above.    PAIN:  Are you having pain? No  PRECAUTIONS: Fall.  Patient had a fall related to leaning on a unstable table and once when she felt exhausted.  Recommended she use a cane.    RED FLAGS: None   WEIGHT BEARING RESTRICTIONS: No  FALLS:  Has patient fallen in last 6 months? Yes.  Number of falls 2.  LIVING ENVIRONMENT: Lives in: House/apartment Has following equipment at home: None  PLOF: Independent with basic ADLs  PATIENT GOALS: Get stronger.    OBJECTIVE:  POSTURE: rounded shoulders, forward head, and flexed trunk , bent knees during amb   LOWER/UPPER EXTREMITY ROM: WFL.  LOWER/UPPER EXTREMITY MMT: Bilat hip flexion: 5/5 Bilat knee ext: 4/5 R knee flexion: 5/5, L knee flexion 4/5 R hip abd sitting: 5/5, L hip abd sitting: 4/5  FUNCTIONAL TESTS:  5 times sit to stand: 10/15: 12.2 seconds; 11/17: 11 seconds; 10/28/24: 12.43 sec  Timed up and go (TUG): 18 seconds; 10/15: 16.5 seconds; 11/17: 14.77 seconds; 10/28/24: 15.13 sec Tandem stance: 11/17: <1 sec R & L; 12/15: able to maintain 10 sec in wide tandem with SBA; 10/28/24: 3 sec R LE back, 8 sec L LE back in wide tandem stance DGI: 10/24  [facility] PT Assessment - 10/28/24 0001       Standardized Balance Assessment   Standardized Balance Assessment Dynamic Gait Index      Dynamic Gait Index   Level Surface Moderate Impairment    Change in Gait Speed Moderate Impairment    Gait with Horizontal Head Turns Moderate Impairment    Gait with Vertical Head Turns Moderate Impairment    Gait and  Pivot Turn Mild Impairment    Step Over Obstacle Moderate Impairment    Step Around Obstacles Mild Impairment    Steps Moderate Impairment    Total Score 10          GAIT: R LE externally rotated, shuffling gait with poor foot clearance bilat, decreased heel/toe pattern, maintains bilat knees in slight flexion  STAIR NAVIGATION:  12/15: Able to perform reciprocal pattern with heavy cueing and SBA -- challenged with eccentric control of R LE during step downs                                                                                                                               TREATMENT DATE:   10/28/24 Nustep level 3 x 12 min UEs/LEs     PATIENT EDUCATION:  Education details: HEP Person  educated: Patient Education method: Industrial/product Designer, demonstration, handout Education comprehension: Verbalized understanding, demonstrated to PT  HOME EXERCISE PROGRAM: Access Code: [email or web address] URL: [email or web address] Date: 08/05/2024 Prepared by: Kim Harris  Exercises - Seated March with Resistance  - 1 x daily - 7 x weekly - 2 sets - 10 reps - Seated Isometric Hip Abduction with Resistance  - 1 x daily - 7 x weekly - 2 sets - 10 reps - Seated Knee Extension with Resistance  - 1 x daily - 7 x weekly - 2 sets - 10 reps - Seated Hamstring Curls with Resistance  - 1 x daily - 7 x weekly - 2 sets - 10 reps - Sit to Stand Without Arm Support  - 1 x daily - 7 x weekly - 2 sets - 10 reps - Side Stepping with Resistance at Ankles  - 1 x daily - 7 x weekly - 2 sets - 10 reps - Backward Monster Walks  - 1 x daily - 7 x weekly - 2 sets - 10 reps - Forward Monster Walks  - 1 x daily - 7 x weekly - 2 sets - 10 reps  ASSESSMENT:  CLINICAL IMPRESSION: Kim Harris is a 69 y/o F who returns to PT for deconditioning. Pt continues to await her liver transplant. Has done PT in the past for strengthening and balance. Pt states she has not yet joined a gym and was hoping for more PT visits. 5x STS has stabilized to 11-12 sec which demos good functional LE strength; however, pt still presents a fall risk during amb based on her DGI and TUG. Most unstable with her dynamic balance and with narrow BOS. Will request more PT visits to continue to improve her overall community and home safety to maximize her level of function to transition her fully into a community wellness program.   OBJECTIVE IMPAIRMENTS: Abnormal gait, decreased activity tolerance, and decreased endurance.   ACTIVITY LIMITATIONS: carrying, lifting, bending, and locomotion level  PARTICIPATION LIMITATIONS: meal prep, cleaning, laundry, community activity, and yard work  PERSONAL FACTORS: Time since onset of  injury/illness/exacerbation and  1 comorbidity: liver issues are also affecting patient's functional outcome.   REHAB POTENTIAL: Fair plus/Good minus  CLINICAL DECISION MAKING: Evolving/moderate complexity  EVALUATION COMPLEXITY: Low   GOALS:  SHORT TERM GOALS: Target date: 11/18/2024   Ind with a HEP. Goal status: MET   LONG TERM GOALS: Target date: 12/09/2024   Pt will be able to transition to a community wellness program  10/28/24: Doing HEP with sister's assist Goal status: INITIAL  2.  Improve TUG to </=13 sec for reduced fall risk  10/28/24: 15.13 sec Goal status: INITIAL  3.  Pt will have improved DGI to >/=18 to reduce fall risk  10/28/24: 10 Goal status: INITIAL  4.  Will be able to ascend/descend 5 stairs with a reciprocal pattern and 1 hand rail for safety to demo improving hip strength.  Goal status: IN PROGRESS  5.  Will be able to maintain tandem stance for at least 10 sec to demo improved balance and reduced fall risk  10/28/24: 3 sec R LE back, 8 sec L LE back in wide tandem stance Goal status: INITIAL  PLAN:  PT FREQUENCY: 1-2x/week  PT DURATION: 6 weeks  PLANNED INTERVENTIONS: 97110-Therapeutic exercises, 97530- Therapeutic activity, 97112- Neuromuscular re-education, 97535- Self Care, and 02859- Manual therapy  PLAN FOR NEXT SESSION: Nustep, dynamic gait/balance, work on stepping to improve foot clearance, tandem stance   Laylonie Marzec April Ma L Nivia Gervase, PT, DPT 10/28/2024, 11:41 AM   "

## 2024-10-30 ENCOUNTER — Ambulatory Visit

## 2024-10-30 DIAGNOSIS — R2689 Other abnormalities of gait and mobility: Secondary | ICD-10-CM | POA: Diagnosis not present

## 2024-10-30 DIAGNOSIS — M6281 Muscle weakness (generalized): Secondary | ICD-10-CM

## 2024-10-30 NOTE — Therapy (Signed)
 " OUTPATIENT PHYSICAL THERAPY TREATMENT   Patient Name: Kim Harris MRN: [id number] DOB:[birth year], 69 y.o., female Today's Date: 10/30/2024  END OF SESSION:  PT End of Session - 10/30/24 1101     Visit Number 2    Number of Visits 10    Date for Recertification  12/09/24    Authorization Type [employer]    PT Start Time 1057    PT Stop Time 1143    PT Time Calculation (min) 46 min    Activity Tolerance Patient tolerated treatment well            Past Medical History:  Diagnosis Date   Anasarca    Anemia    Anxiety    Cirrhosis (HCC)    Depression    HCV (hepatitis C virus)    Hepatitis    c   Pneumonia    Septic prepatellar bursitis of left knee 05/18/2017   Past Surgical History:  Procedure Laterality Date   CHOLECYSTECTOMY     I & D EXTREMITY Left 05/18/2017   Procedure: IRRIGATION AND DEBRIDEMENT INFECTED LEFT KNEE WITH PRE-PATELLA BURSECTOMY;  Surgeon: Josefina Chew, MD;  Location: [facility] OR;  Service: Orthopedics;  Laterality: Left;   Patient Active Problem List   Diagnosis Date Noted   Septic prepatellar bursitis of left knee 05/18/2017   HEPATITIS C 02/07/2010   Hepatic cirrhosis (HCC) 02/07/2010   ALCOHOLIC CIRRHOSIS OF LIVER 02/04/2010   Disorder of liver 02/02/2010   REFERRING PROVIDER: RONAL Donna Magnuson MD  REFERRING DIAG: Debility, weakness.  THERAPY DIAG:  Other abnormalities of gait and mobility  Muscle weakness (generalized)  Rationale for Evaluation and Treatment: Rehabilitation  ONSET DATE: Ongoing.    SUBJECTIVE:   SUBJECTIVE STATEMENT: Pt denies any pain today, reports feeling good today.    PERTINENT HISTORY: Please see above.    PAIN:  Are you having pain? No  PRECAUTIONS: Fall.  Patient had a fall related to leaning on a unstable table and once when she felt exhausted.  Recommended she use a cane.    RED FLAGS: None   WEIGHT BEARING RESTRICTIONS: No  FALLS:  Has patient fallen in last 6 months? Yes. Number of  falls 2.  LIVING ENVIRONMENT: Lives in: House/apartment Has following equipment at home: None  PLOF: Independent with basic ADLs  PATIENT GOALS: Get stronger.    OBJECTIVE:  POSTURE: rounded shoulders, forward head, and flexed trunk , bent knees during amb   LOWER/UPPER EXTREMITY ROM: WFL.  LOWER/UPPER EXTREMITY MMT: Bilat hip flexion: 5/5 Bilat knee ext: 4/5 R knee flexion: 5/5, L knee flexion 4/5 R hip abd sitting: 5/5, L hip abd sitting: 4/5  FUNCTIONAL TESTS:  5 times sit to stand: 10/15: 12.2 seconds; 11/17: 11 seconds; 10/28/24: 12.43 sec  Timed up and go (TUG): 18 seconds; 10/15: 16.5 seconds; 11/17: 14.77 seconds; 10/28/24: 15.13 sec Tandem stance: 11/17: <1 sec R & L; 12/15: able to maintain 10 sec in wide tandem with SBA; 10/28/24: 3 sec R LE back, 8 sec L LE back in wide tandem stance DGI: 10/24    GAIT: R LE externally rotated, shuffling gait with poor foot clearance bilat, decreased heel/toe pattern, maintains bilat knees in slight flexion  STAIR NAVIGATION:  12/15: Able to perform reciprocal pattern with heavy cueing and SBA -- challenged with eccentric control of R LE during step downs  TREATMENT DATE:   10/30/24                                  EXERCISE LOG  Exercise Repetitions and Resistance Comments  Nustep Lvl 3 x 15 mins   Rockerboard 4 mins   Toe Taps 4 box x 30 reps alternating   Standing Marches Airex, high knee x 30 reps bil   LAQs 4# x 30 reps bil   Seated Marches 4# x 30 reps bil   Seated Ham Curls Green x 30 reps bil   STS w overhead press 2# x 15 reps    Blank cell = exercise not performed today    10/28/24 Nustep level 3 x 12 min UEs/LEs   PATIENT EDUCATION:  Education details: HEP Person educated: Patient Education method: Industrial/product Designer, demonstration, handout Education comprehension: Verbalized understanding,  demonstrated to PT  HOME EXERCISE PROGRAM: Access Code: [email or web address] URL: [email or web address] Date: 08/05/2024 Prepared by: Gellen April Earnie Starring  Exercises - Seated March with Resistance  - 1 x daily - 7 x weekly - 2 sets - 10 reps - Seated Isometric Hip Abduction with Resistance  - 1 x daily - 7 x weekly - 2 sets - 10 reps - Seated Knee Extension with Resistance  - 1 x daily - 7 x weekly - 2 sets - 10 reps - Seated Hamstring Curls with Resistance  - 1 x daily - 7 x weekly - 2 sets - 10 reps - Sit to Stand Without Arm Support  - 1 x daily - 7 x weekly - 2 sets - 10 reps - Side Stepping with Resistance at Ankles  - 1 x daily - 7 x weekly - 2 sets - 10 reps - Backward Monster Walks  - 1 x daily - 7 x weekly - 2 sets - 10 reps - Forward Monster Walks  - 1 x daily - 7 x weekly - 2 sets - 10 reps  ASSESSMENT:  CLINICAL IMPRESSION: Pt arrives for today's treatment session denying any pain.  Pt states that she has been feeling pretty good as of late. Pt introduced to standing and seated exercises today to increase, strength, function, and balance.  Pt requiring min cues for proper technique.  Pt requiring mod cues for sequencing for toe taps.  Pt given seated rest breaks as needed due to fatigue.  Pt denied any pain at completion of today's treatment session.   OBJECTIVE IMPAIRMENTS: Abnormal gait, decreased activity tolerance, and decreased endurance.   ACTIVITY LIMITATIONS: carrying, lifting, bending, and locomotion level  PARTICIPATION LIMITATIONS: meal prep, cleaning, laundry, community activity, and yard work  PERSONAL FACTORS: Time since onset of injury/illness/exacerbation and 1 comorbidity: liver issues are also affecting patient's functional outcome.   REHAB POTENTIAL: Fair plus/Good minus  CLINICAL DECISION MAKING: Evolving/moderate complexity  EVALUATION COMPLEXITY: Low   GOALS:  SHORT TERM GOALS: Target date: 11/18/2024   Ind with a HEP. Goal status:  MET   LONG TERM GOALS: Target date: 12/09/2024   Pt will be able to transition to a community wellness program  10/28/24: Doing HEP with sister's assist Goal status: INITIAL  2.  Improve TUG to </=13 sec for reduced fall risk  10/28/24: 15.13 sec Goal status: INITIAL  3.  Pt will have improved DGI to >/=18 to reduce fall risk  10/28/24: 10 Goal status: INITIAL  4.  Will be able to ascend/descend 5 stairs with a reciprocal  pattern and 1 hand rail for safety to demo improving hip strength.  Goal status: IN PROGRESS  5.  Will be able to maintain tandem stance for at least 10 sec to demo improved balance and reduced fall risk  10/28/24: 3 sec R LE back, 8 sec L LE back in wide tandem stance Goal status: INITIAL  PLAN:  PT FREQUENCY: 1-2x/week  PT DURATION: 6 weeks  PLANNED INTERVENTIONS: 97110-Therapeutic exercises, 97530- Therapeutic activity, 97112- Neuromuscular re-education, 97535- Self Care, and [id number]- Manual therapy  PLAN FOR NEXT SESSION: Nustep, dynamic gait/balance, work on stepping to improve foot clearance, tandem stance   Delon DELENA Gosling, PTA, DPT 10/30/2024, 11:43 AM   "

## 2024-11-11 ENCOUNTER — Ambulatory Visit

## 2024-11-13 ENCOUNTER — Ambulatory Visit
